# Patient Record
Sex: Female | Born: 1950 | Race: White | Hispanic: No | Marital: Single | Smoking: Former smoker
Health system: Southern US, Community
[De-identification: ages and names within clinical notes are randomized; demographics above are authoritative.]

## PROBLEM LIST (undated history)

## (undated) DIAGNOSIS — I10 Essential (primary) hypertension: Secondary | ICD-10-CM

## (undated) DIAGNOSIS — E7800 Pure hypercholesterolemia, unspecified: Secondary | ICD-10-CM

## (undated) DIAGNOSIS — I2510 Atherosclerotic heart disease of native coronary artery without angina pectoris: Secondary | ICD-10-CM

## (undated) DIAGNOSIS — K635 Polyp of colon: Secondary | ICD-10-CM

## (undated) HISTORY — DX: Polyp of colon: K63.5

## (undated) HISTORY — PX: KNEE SURGERY: SHX244

## (undated) HISTORY — PX: BREAST EXCISIONAL BIOPSY: SUR124

## (undated) HISTORY — PX: TONSILLECTOMY: SUR1361

---

## 2009-02-14 ENCOUNTER — Ambulatory Visit: Payer: Self-pay | Admitting: Psychiatry

## 2009-02-28 ENCOUNTER — Ambulatory Visit: Payer: Self-pay | Admitting: Psychiatry

## 2010-09-12 ENCOUNTER — Encounter: Admission: RE | Admit: 2010-09-12 | Discharge: 2010-09-12 | Payer: Self-pay | Admitting: Family Medicine

## 2011-01-21 ENCOUNTER — Encounter: Payer: Self-pay | Admitting: Family Medicine

## 2011-11-21 ENCOUNTER — Other Ambulatory Visit: Payer: Self-pay | Admitting: Family Medicine

## 2011-11-21 DIAGNOSIS — Z1231 Encounter for screening mammogram for malignant neoplasm of breast: Secondary | ICD-10-CM

## 2011-12-04 ENCOUNTER — Ambulatory Visit
Admission: RE | Admit: 2011-12-04 | Discharge: 2011-12-04 | Disposition: A | Discharge status: Home / Self Care | Payer: Self-pay | Source: Ambulatory Visit | Attending: Family Medicine | Admitting: Family Medicine

## 2011-12-04 DIAGNOSIS — Z1231 Encounter for screening mammogram for malignant neoplasm of breast: Secondary | ICD-10-CM

## 2012-07-22 ENCOUNTER — Other Ambulatory Visit: Payer: Self-pay | Admitting: Internal Medicine

## 2012-07-22 ENCOUNTER — Ambulatory Visit (INDEPENDENT_AMBULATORY_CARE_PROVIDER_SITE_OTHER): Payer: 59

## 2012-07-22 DIAGNOSIS — M545 Low back pain: Secondary | ICD-10-CM

## 2012-07-22 DIAGNOSIS — M5137 Other intervertebral disc degeneration, lumbosacral region: Secondary | ICD-10-CM

## 2012-07-22 DIAGNOSIS — M5126 Other intervertebral disc displacement, lumbar region: Secondary | ICD-10-CM

## 2012-12-29 ENCOUNTER — Other Ambulatory Visit: Payer: Self-pay | Admitting: Family Medicine

## 2012-12-29 DIAGNOSIS — Z1231 Encounter for screening mammogram for malignant neoplasm of breast: Secondary | ICD-10-CM

## 2012-12-30 ENCOUNTER — Ambulatory Visit (INDEPENDENT_AMBULATORY_CARE_PROVIDER_SITE_OTHER): Payer: 59

## 2012-12-30 DIAGNOSIS — Z1231 Encounter for screening mammogram for malignant neoplasm of breast: Secondary | ICD-10-CM

## 2013-06-11 IMAGING — CR DG LUMBAR SPINE 2-3V
3 series · 3 of 3 positions shown · non-contrast
Comparison: none

CLINICAL DATA: Back pain and left radiculopathy.

LUMBAR SPINE - 2-3 VIEW

[view not recorded (1 of 3)]
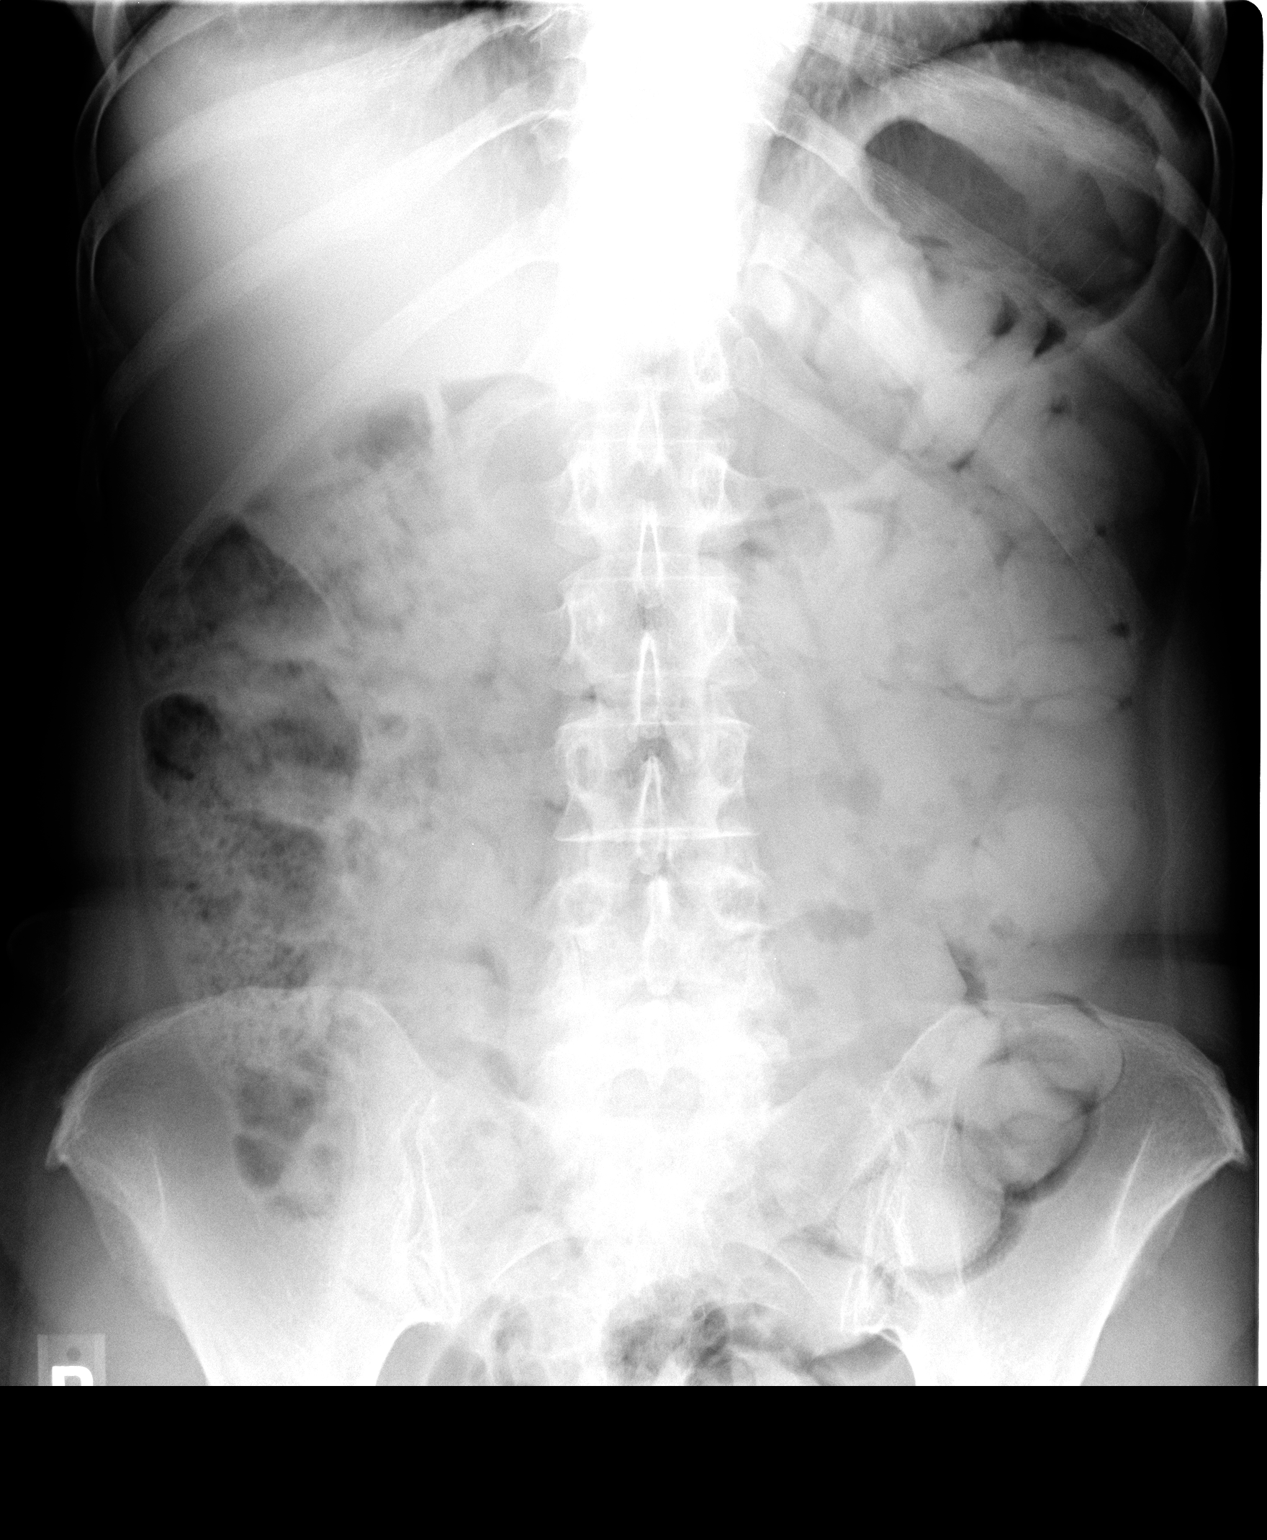

[view not recorded (2 of 3)]
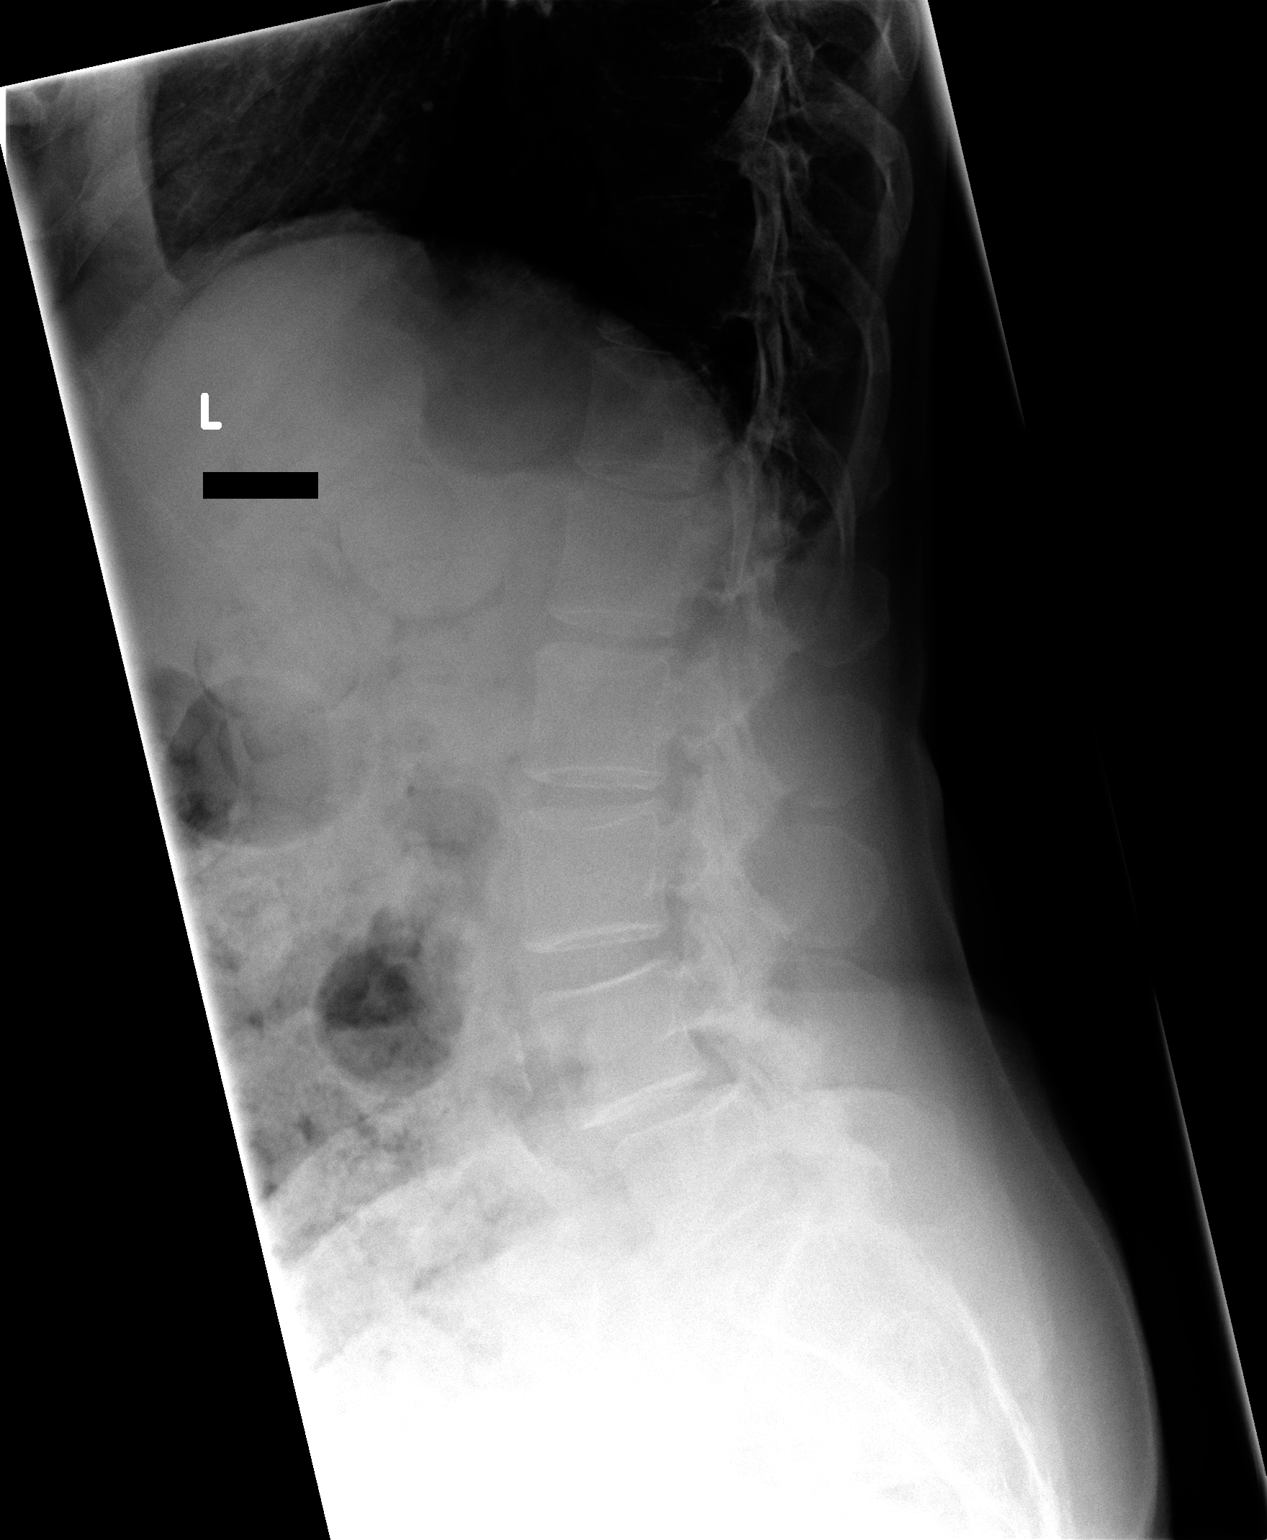

[view not recorded (3 of 3)]
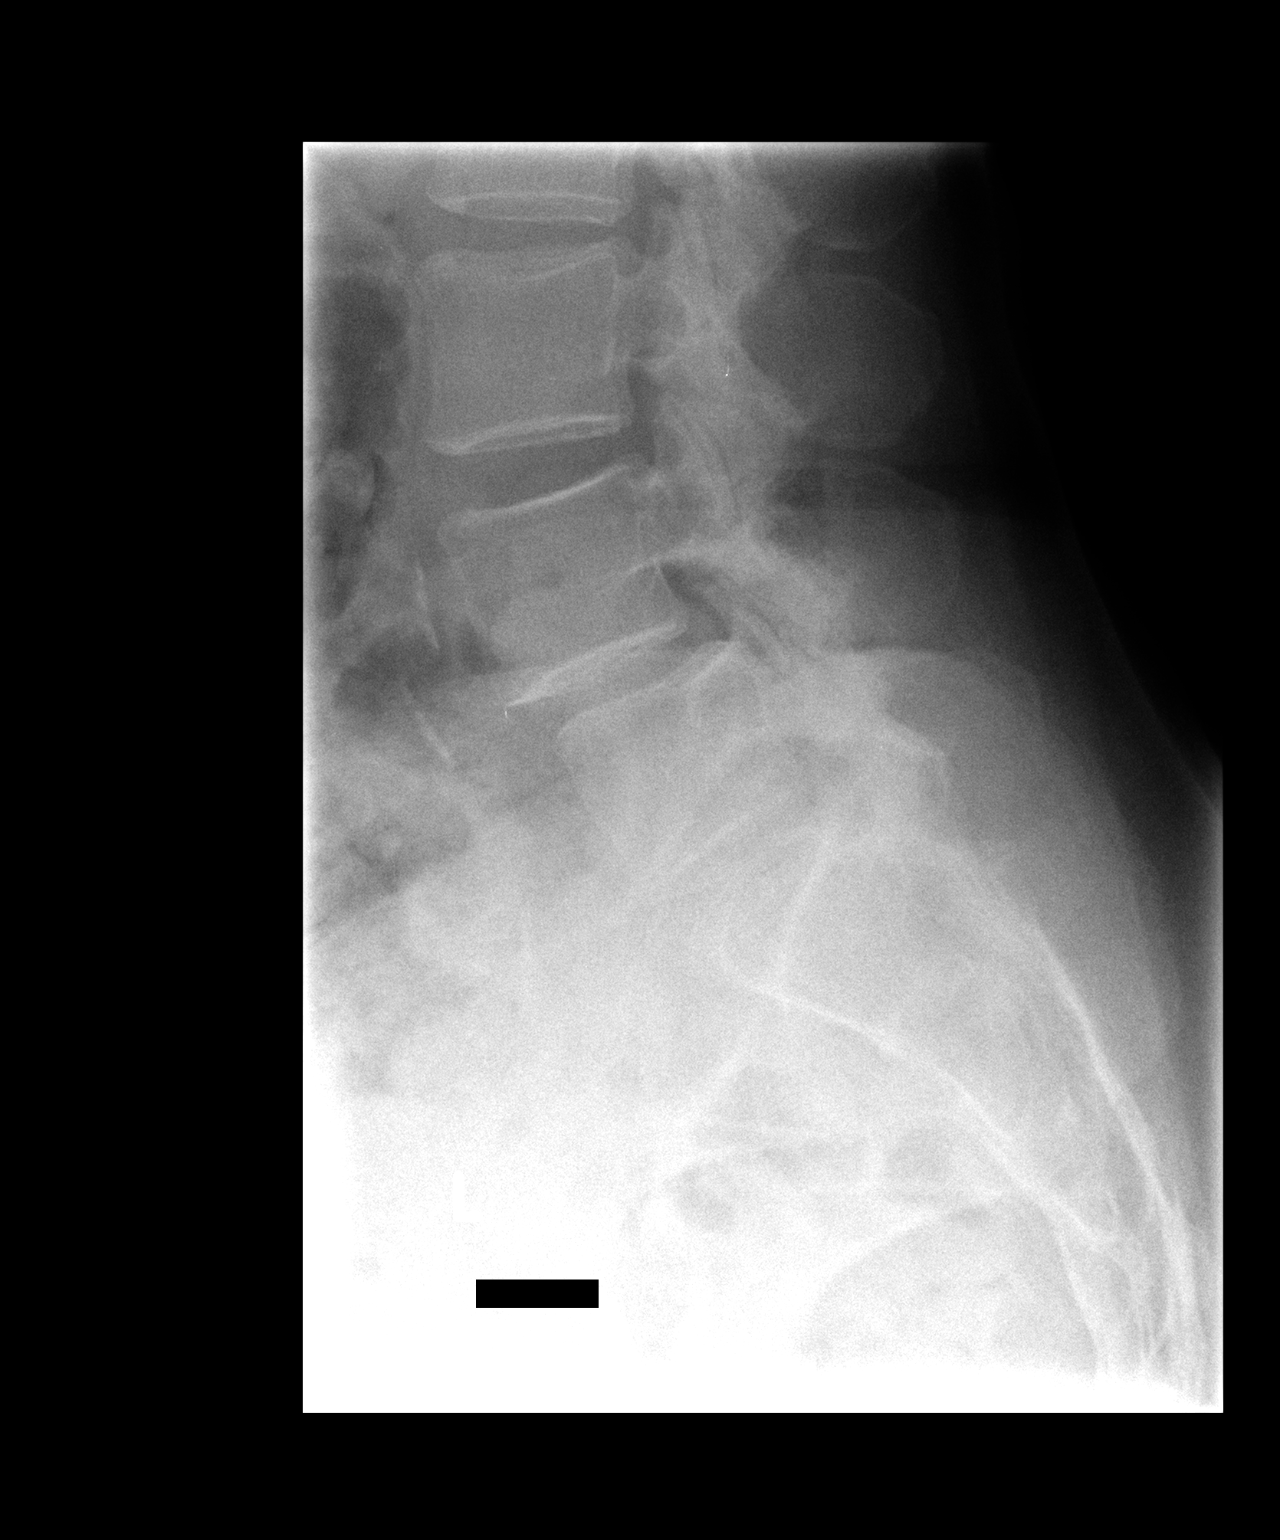

[3 of 3 positions shown; findings below may reference images not displayed]

FINDINGS: 6 mm of facet mediated slip L4-L5.  Mild vascular
calcification.  No osseous destructive lesion.  No visible pars
defects.  Mild disc space narrowing L5-S1.  No visible abnormal
calcifications in the abdomen.  Moderate stool burden.
IMPRESSION: 6 mm anterolisthesis L4-5.  This is likely facet mediated.  Mild
disc space narrowing L5-S1.

## 2013-12-25 ENCOUNTER — Other Ambulatory Visit: Payer: Self-pay | Admitting: Family Medicine

## 2013-12-25 DIAGNOSIS — Z1231 Encounter for screening mammogram for malignant neoplasm of breast: Secondary | ICD-10-CM

## 2014-01-05 ENCOUNTER — Ambulatory Visit (INDEPENDENT_AMBULATORY_CARE_PROVIDER_SITE_OTHER): Payer: 59

## 2014-01-05 DIAGNOSIS — Z1231 Encounter for screening mammogram for malignant neoplasm of breast: Secondary | ICD-10-CM

## 2014-01-05 DIAGNOSIS — R928 Other abnormal and inconclusive findings on diagnostic imaging of breast: Secondary | ICD-10-CM

## 2014-01-08 ENCOUNTER — Other Ambulatory Visit: Payer: Self-pay | Admitting: Family Medicine

## 2014-01-08 DIAGNOSIS — R928 Other abnormal and inconclusive findings on diagnostic imaging of breast: Secondary | ICD-10-CM

## 2014-01-21 ENCOUNTER — Ambulatory Visit
Admission: RE | Admit: 2014-01-21 | Discharge: 2014-01-21 | Disposition: A | Discharge status: Home / Self Care | Payer: 59 | Source: Ambulatory Visit | Attending: Family Medicine | Admitting: Family Medicine

## 2014-01-21 DIAGNOSIS — R928 Other abnormal and inconclusive findings on diagnostic imaging of breast: Secondary | ICD-10-CM

## 2015-01-27 ENCOUNTER — Other Ambulatory Visit: Payer: Self-pay | Admitting: Family Medicine

## 2015-01-27 DIAGNOSIS — Z139 Encounter for screening, unspecified: Secondary | ICD-10-CM

## 2015-02-03 ENCOUNTER — Ambulatory Visit (INDEPENDENT_AMBULATORY_CARE_PROVIDER_SITE_OTHER): Payer: 59

## 2015-02-03 DIAGNOSIS — Z1231 Encounter for screening mammogram for malignant neoplasm of breast: Secondary | ICD-10-CM

## 2015-02-03 DIAGNOSIS — Z139 Encounter for screening, unspecified: Secondary | ICD-10-CM

## 2016-03-12 ENCOUNTER — Other Ambulatory Visit: Payer: Self-pay | Admitting: Family Medicine

## 2016-03-12 DIAGNOSIS — Z1231 Encounter for screening mammogram for malignant neoplasm of breast: Secondary | ICD-10-CM

## 2016-03-14 ENCOUNTER — Ambulatory Visit (INDEPENDENT_AMBULATORY_CARE_PROVIDER_SITE_OTHER): Payer: 59

## 2016-03-14 DIAGNOSIS — Z1231 Encounter for screening mammogram for malignant neoplasm of breast: Secondary | ICD-10-CM | POA: Diagnosis not present

## 2016-06-17 ENCOUNTER — Emergency Department
Admission: EM | Admit: 2016-06-17 | Discharge: 2016-06-17 | Disposition: A | Discharge status: Home / Self Care | Payer: 59 | Source: Home / Self Care | Attending: Family Medicine | Admitting: Family Medicine

## 2016-06-17 ENCOUNTER — Encounter: Payer: Self-pay | Admitting: Emergency Medicine

## 2016-06-17 DIAGNOSIS — M25552 Pain in left hip: Secondary | ICD-10-CM

## 2016-06-17 DIAGNOSIS — M5432 Sciatica, left side: Secondary | ICD-10-CM

## 2016-06-17 HISTORY — DX: Atherosclerotic heart disease of native coronary artery without angina pectoris: I25.10

## 2016-06-17 HISTORY — DX: Pure hypercholesterolemia, unspecified: E78.00

## 2016-06-17 HISTORY — DX: Essential (primary) hypertension: I10

## 2016-06-17 MED ORDER — METHYLPREDNISOLONE ACETATE 40 MG/ML IJ SUSP
80.0000 mg | Freq: Once | INTRAMUSCULAR | Status: AC
  Administered 2016-06-17: 80 mg via INTRAMUSCULAR

## 2016-06-17 MED ORDER — KETOROLAC TROMETHAMINE 60 MG/2ML IM SOLN
30.0000 mg | Freq: Once | INTRAMUSCULAR | Status: AC
  Administered 2016-06-17: 30 mg via INTRAMUSCULAR

## 2016-06-17 NOTE — ED Provider Notes (Signed)
CSN: [id number]     Arrival date & time 06/17/16  1519 History   First MD Initiated Contact with Patient 06/17/16 1543     Chief Complaint  Patient presents with  . Sciatica   (Consider location/radiation/quality/duration/timing/severity/associated sxs/prior Treatment) HPI  Kendra Proctor is a 65 y.o. female presenting to UC with c/o gradually worsening Left sided hip pain that radiates into her Left thigh. Pain is aching and sharp, worse with standing and ambulating. Pain is 5/10, no relief with acetaminophen.  Pain started last night after working outside yesterday. Denies known injury but does report hx of similar pain.  Per medical records, pt received a shot of depomedrol and symptoms resolved. Pt states she cannot tolerate PO prednisone as it causes diffuse swelling.   Past Medical History  Diagnosis Date  . Hypertension   . Coronary artery disease   . High cholesterol    Past Surgical History  Procedure Laterality Date  . Tonsillectomy    . Knee surgery     History reviewed. No pertinent family history. Social History  Substance Use Topics  . Smoking status: Former Smoker    Quit date: 06/17/2014  . Smokeless tobacco: None  . Alcohol Use: Yes     Comment: Occasional   OB History    No data available     Review of Systems  Constitutional: Negative for fever and chills.  Genitourinary: Negative for dysuria, frequency, hematuria and flank pain.  Musculoskeletal: Positive for myalgias, back pain, arthralgias and gait problem. Negative for joint swelling, neck pain and neck stiffness.  Skin: Negative for rash.    Allergies  Prednisone; Propoxyphene; and Wellbutrin  Home Medications   Prior to Admission medications   Medication Sig Start Date End Date Taking? Authorizing Provider  FLUoxetine (PROZAC) 20 MG capsule Take 20 mg by mouth daily.   Yes Historical Provider, MD  simvastatin (ZOCOR) 80 MG tablet Take 80 mg by mouth daily.   Yes Historical Provider, MD    Meds Ordered and Administered this Visit   Medications  ketorolac (TORADOL) injection 30 mg (30 mg Intramuscular Given 06/17/16 1557)  methylPREDNISolone acetate (DEPO-MEDROL) injection 80 mg (80 mg Intramuscular Given 06/17/16 1558)    BP 161/83 mmHg  Pulse 81  Temp(Src) 97.7 F (36.5 C) (Oral)  Resp 16  Ht 5' 3.5" (1.613 m)  Wt 170 lb (77.111 kg)  BMI 29.64 kg/m2  SpO2 98% No data found.   Physical Exam  Constitutional: She is oriented to person, place, and time. She appears well-developed and well-nourished.  HENT:  Head: Normocephalic and atraumatic.  Eyes: EOM are normal.  Neck: Normal range of motion.  Cardiovascular: Normal rate.   Pulmonary/Chest: Effort normal.  Musculoskeletal: Normal range of motion. She exhibits tenderness. She exhibits no edema.  No midline spinal tenderness. Mild tenderness to Left lower back muscles, tenderness over Left hip, Left buttock, and Left lateral thigh. No crepitus. Full ROM. Negative straight leg raise.   Neurological: She is alert and oriented to person, place, and time.  Antalgic gait.  Skin: Skin is warm and dry. No rash noted. No erythema.  Psychiatric: She has a normal mood and affect. Her behavior is normal.  Nursing note and vitals reviewed.   ED Course  Procedures (including critical care time)  Labs Review Labs Reviewed - No data to display  Imaging Review No results found.   MDM   1. Left hip pain   2. Left sided sciatica    Hx and  exam c/w Left sided sciatica. No red flag symptoms. No indication for imaging at this time.  Tx in UC: Depomedrol 80mg  IM and Toradol 30mg  IM  Pt info provided with home exercises. May use acetaminophen and ibuprofen at home. Alternate ice and heat.  Encouraged f/u with PCP or Sports Medicine in 1 week if not improving. Patient verbalized understanding and  agreement with treatment plan.     Junius Finnerrin O'Malley, PA-C 06/17/16 1610

## 2016-06-17 NOTE — ED Notes (Signed)
Patient advised she was working outside yesterday and shortly after developed pain in the left hip radiating down the left leg.

## 2016-11-02 DIAGNOSIS — L219 Seborrheic dermatitis, unspecified: Secondary | ICD-10-CM | POA: Insufficient documentation

## 2017-02-10 ENCOUNTER — Encounter: Payer: Self-pay | Admitting: Emergency Medicine

## 2017-02-10 ENCOUNTER — Emergency Department
Admission: EM | Admit: 2017-02-10 | Discharge: 2017-02-10 | Disposition: A | Discharge status: Home / Self Care | Payer: 59 | Source: Home / Self Care | Attending: Family Medicine | Admitting: Family Medicine

## 2017-02-10 DIAGNOSIS — N3001 Acute cystitis with hematuria: Secondary | ICD-10-CM | POA: Diagnosis not present

## 2017-02-10 LAB — POCT URINALYSIS DIP (MANUAL ENTRY)
BILIRUBIN UA: NEGATIVE
Bilirubin, UA: NEGATIVE
Glucose, UA: NEGATIVE
Nitrite, UA: NEGATIVE
PH UA: 6 (ref 5–8)
SPEC GRAV UA: 1.025 (ref 1.005–1.03)
Urobilinogen, UA: 4 (ref 0–1)

## 2017-02-10 MED ORDER — CEPHALEXIN 500 MG PO CAPS: 500.0000 mg | ORAL_CAPSULE | Freq: Two times a day (BID) | ORAL | 0 refills | Status: DC

## 2017-02-10 NOTE — ED Triage Notes (Signed)
[facility]/O dysuria, spasms and urgency. Symptoms since last PM.

## 2017-02-10 NOTE — ED Provider Notes (Signed)
CSN: [id number]     Arrival date & time 02/10/17  1509 History   First MD Initiated Contact with Patient 02/10/17 1549     Chief Complaint  Patient presents with  . Urinary Tract Infection   (Consider location/radiation/quality/duration/timing/severity/associated sxs/prior Treatment) HPI  Kendra Proctor is a 66 y.o. female presenting to UC with c/o dysuria, bladder spasms, and urinary urgency since last night.  She notes she went out of town recently and has not been drinking as much water as she normally does.  Her last UTI was at least 10 years ago but thinks recent change in her routine is what caused current symptoms. Denies fever, chills, n/v/d. Denies back pain at this time but has mild bladder discomfort.   Past Medical History:  Diagnosis Date  . Coronary artery disease   . High cholesterol   . Hypertension    Past Surgical History:  Procedure Laterality Date  . KNEE SURGERY    . TONSILLECTOMY     History reviewed. No pertinent family history. Social History  Substance Use Topics  . Smoking status: Former Smoker    Quit date: 06/17/2014  . Smokeless tobacco: Never Used  . Alcohol use Yes     Comment: Occasional   OB History    No data available     Review of Systems  Constitutional: Negative for chills and fever.  Gastrointestinal: Positive for abdominal pain ( bladder spasms). Negative for diarrhea, nausea and vomiting.  Genitourinary: Positive for dysuria, frequency and urgency. Negative for decreased urine volume, hematuria, vaginal bleeding, vaginal discharge and vaginal pain.  Musculoskeletal: Negative for back pain and myalgias.  Skin: Negative for rash.    Allergies  Prednisone; Propoxyphene; and Wellbutrin [bupropion]  Home Medications   Prior to Admission medications   Medication Sig Start Date End Date Taking? Authorizing Provider  lisinopril (PRINIVIL,ZESTRIL) 5 MG tablet Take 5 mg by mouth daily.   Yes Historical Provider, MD  cephALEXin  (KEFLEX) 500 MG capsule Take 1 capsule (500 mg total) by mouth 2 (two) times daily. 02/10/17   Junius FinnerErin O'Malley, PA-C  FLUoxetine (PROZAC) 20 MG capsule Take 20 mg by mouth daily.    Historical Provider, MD  simvastatin (ZOCOR) 80 MG tablet Take 80 mg by mouth daily.    Historical Provider, MD   Meds Ordered and Administered this Visit  Medications - No data to display  BP 144/85   Pulse 85   Temp 98.2 F (36.8 C) (Oral)   Resp 16   Ht 5\' 3"  (1.6 m)   Wt 166 lb 4 oz (75.4 kg)   SpO2 99%   BMI 29.45 kg/m  No data found.   Physical Exam  Constitutional: She is oriented to person, place, and time. She appears well-developed and well-nourished. No distress.  HENT:  Head: Normocephalic and atraumatic.  Mouth/Throat: Oropharynx is clear and moist.  Eyes: EOM are normal.  Neck: Normal range of motion.  Cardiovascular: Normal rate and regular rhythm.   Pulmonary/Chest: Effort normal and breath sounds normal. No respiratory distress. She has no wheezes. She has no rales.  Abdominal: Soft. She exhibits no distension and no mass. There is no tenderness. There is no rebound, no guarding and no CVA tenderness.  Musculoskeletal: Normal range of motion.  Neurological: She is alert and oriented to person, place, and time.  Skin: Skin is warm and dry. She is not diaphoretic.  Psychiatric: She has a normal mood and affect. Her behavior is normal.  Nursing note  and vitals reviewed.   Urgent Care Course     Procedures (including critical care time)  Labs Review Labs Reviewed  POCT URINALYSIS DIP (MANUAL ENTRY) - Abnormal; Notable for the following:       Result Value   Blood, UA moderate (*)    Protein Ur, POC trace (*)    Leukocytes, UA small (1+) (*)    All other components within normal limits  URINE CULTURE    Imaging Review No results found.    MDM   1. Acute cystitis with hematuria    Hx and exam c/w UTI, will send urine culture.  Rx: Keflex  Encouraged good  hydration. F/u with PCP last this week if not improving, sooner if worsening.     Junius Finner, PA-C 02/10/17 1739

## 2017-02-12 ENCOUNTER — Telehealth: Payer: Self-pay

## 2017-02-12 NOTE — Telephone Encounter (Signed)
Left message with lab results and to call UC if any questions or concerns.

## 2017-02-13 LAB — URINE CULTURE

## 2017-02-27 ENCOUNTER — Other Ambulatory Visit: Payer: Self-pay | Admitting: Family Medicine

## 2017-02-27 DIAGNOSIS — Z1231 Encounter for screening mammogram for malignant neoplasm of breast: Secondary | ICD-10-CM

## 2017-04-05 ENCOUNTER — Ambulatory Visit: Payer: 59

## 2017-05-08 ENCOUNTER — Ambulatory Visit (INDEPENDENT_AMBULATORY_CARE_PROVIDER_SITE_OTHER): Payer: 59

## 2017-05-08 DIAGNOSIS — Z1231 Encounter for screening mammogram for malignant neoplasm of breast: Secondary | ICD-10-CM | POA: Diagnosis not present

## 2017-11-20 ENCOUNTER — Other Ambulatory Visit: Payer: Self-pay | Admitting: Family Medicine

## 2017-11-20 DIAGNOSIS — Z780 Asymptomatic menopausal state: Secondary | ICD-10-CM

## 2017-11-27 ENCOUNTER — Ambulatory Visit (INDEPENDENT_AMBULATORY_CARE_PROVIDER_SITE_OTHER): Payer: 59

## 2017-11-27 DIAGNOSIS — M85851 Other specified disorders of bone density and structure, right thigh: Secondary | ICD-10-CM

## 2017-11-27 DIAGNOSIS — Z780 Asymptomatic menopausal state: Secondary | ICD-10-CM

## 2017-11-29 DIAGNOSIS — M85852 Other specified disorders of bone density and structure, left thigh: Secondary | ICD-10-CM | POA: Insufficient documentation

## 2018-01-04 ENCOUNTER — Other Ambulatory Visit: Payer: Self-pay

## 2018-01-04 ENCOUNTER — Encounter: Payer: Self-pay | Admitting: Emergency Medicine

## 2018-01-04 ENCOUNTER — Emergency Department
Admission: EM | Admit: 2018-01-04 | Discharge: 2018-01-04 | Disposition: A | Discharge status: Home / Self Care | Payer: 59 | Source: Home / Self Care | Attending: Family Medicine | Admitting: Family Medicine

## 2018-01-04 DIAGNOSIS — R21 Rash and other nonspecific skin eruption: Secondary | ICD-10-CM

## 2018-01-04 MED ORDER — METHYLPREDNISOLONE ACETATE 80 MG/ML IJ SUSP
80.0000 mg | Freq: Once | INTRAMUSCULAR | Status: AC
  Administered 2018-01-04: 80 mg via INTRAMUSCULAR

## 2018-01-04 NOTE — ED Triage Notes (Signed)
[facility] a [facility] of a Rash on entire body

## 2018-01-04 NOTE — ED Provider Notes (Signed)
[facility]    CSN: [id number] Arrival date & time: 01/04/18  1206     History   Chief Complaint Chief Complaint  Patient presents with  . Rash    HPI Kendra Proctor is a 67 y.o. female.   Patient complains of approximately one month history of pruritic rash on her upper arms, forearms, and face.  She had a similar rash about two months ago.  The rash is somewhat improved when she uses a steroid cream and Aveeno wash.  She also has irritation in her scalp that has responded in the past to Derma-Smooth.  No recent medication changes or known contact with allergens.   The history is provided by the patient.  Rash  Location: arms and face. Quality: dryness, itchiness and redness   Quality: not blistering, not burning, not draining, not painful, not peeling, not scaling, not swelling and not weeping   Severity:  Mild Onset quality:  Gradual Duration:  1 month Timing:  Constant Progression:  Unchanged Chronicity:  Recurrent Context: not animal contact, not chemical exposure, not eggs, not exposure to similar rash, not food, not hot tub use, not insect bite/sting, not medications, not new detergent/soap, not nuts and not plant contact   Relieved by:  Nothing Worsened by:  Nothing Ineffective treatments:  Topical steroids Associated symptoms: no abdominal pain, no diarrhea, no fatigue, no fever, no headaches, no induration, no joint pain, no myalgias, no nausea, no shortness of breath, no sore throat and no URI     Past Medical History:  Diagnosis Date  . Coronary artery disease   . High cholesterol   . Hypertension     There are no active problems to display for this patient.   Past Surgical History:  Procedure Laterality Date  . BREAST EXCISIONAL BIOPSY    . KNEE SURGERY    . TONSILLECTOMY      OB History    No data available       Home Medications    Prior to Admission medications   Medication Sig Start Date End Date Taking? Authorizing  Provider  cephALEXin (KEFLEX) 500 MG capsule Take 1 capsule (500 mg total) by mouth 2 (two) times daily. 02/10/17   Lurene Shadow, PA-C  FLUoxetine (PROZAC) 20 MG capsule Take 20 mg by mouth daily.    [provider]  lisinopril (PRINIVIL,ZESTRIL) 5 MG tablet Take 5 mg by mouth daily.    [provider]  simvastatin (ZOCOR) 80 MG tablet Take 80 mg by mouth daily.    [provider]    Family History History reviewed. No pertinent family history.  Social History Social History   Tobacco Use  . Smoking status: Former Smoker    Last attempt to quit: 06/17/2014    Years since quitting: 3.5  . Smokeless tobacco: Never Used  Substance Use Topics  . Alcohol use: Yes    Comment: Occasional  . Drug use: No     Allergies   Prednisone; Propoxyphene; and Wellbutrin [bupropion]   Review of Systems Review of Systems  Constitutional: Negative for fatigue and fever.  HENT: Negative for sore throat.   Respiratory: Negative for shortness of breath.   Gastrointestinal: Negative for abdominal pain, diarrhea and nausea.  Musculoskeletal: Negative for arthralgias and myalgias.  Skin: Positive for rash.  Neurological: Negative for headaches.  All other systems reviewed and are negative.    Physical Exam Triage Vital Signs ED Triage Vitals [01/04/18 1230]  Enc Vitals Group  BP (!) 141/71     Pulse Rate 75     Resp 16     Temp 97.6 F (36.4 C)     Temp Source Oral     SpO2 100 %     Weight 160 lb (72.6 kg)     Height 5\' 3"  (1.6 m)     Head Circumference      Peak Flow      Pain Score 4     Pain Loc      Pain Edu?      Excl. in [place]?    No data found.  Updated Vital Signs BP (!) 141/71 (BP Location: Left Arm)   Pulse 75   Temp 97.6 F (36.4 C) (Oral)   Resp 16   Ht 5\' 3"  (1.6 m)   Wt 160 lb (72.6 kg)   SpO2 100%   BMI 28.34 kg/m   Visual Acuity Right Eye Distance:   Left Eye Distance:   Bilateral Distance:    Right Eye Near:   Left  Eye Near:    Bilateral Near:     Physical Exam  Constitutional: She appears well-developed and well-nourished. No distress.  HENT:  Right Ear: External ear normal.  Left Ear: External ear normal.  Nose: Nose normal.  Mouth/Throat: Oropharynx is clear and moist.  Eyes: Conjunctivae and EOM are normal. Pupils are equal, round, and reactive to light.  Neck: Neck supple.  Cardiovascular: Normal heart sounds.  Pulmonary/Chest: Breath sounds normal.  Abdominal: There is no tenderness.  Musculoskeletal: She exhibits no edema.  Lymphadenopathy:    She has no cervical adenopathy.  Neurological: She is alert.  Skin: Skin is warm and dry. Rash noted. Rash is maculopapular.     Arms reveal hyperkeratotic follicular papules with variable perifollicular erythema on extensor surfaces.  There numerous healed 2 to 5mm diameter healed excoriations on the forearms. Scalp reveals plaque-like erythema, especially at the margins.  Nursing note and vitals reviewed.    UC Treatments / Results  Labs (all labs ordered are listed, but only abnormal results are displayed) Labs Reviewed - No data to display  EKG  EKG Interpretation None       Radiology No results found.  Procedures Procedures (including critical care time)  Medications Ordered in UC Medications  methylPREDNISolone acetate (DEPO-MEDROL) injection 80 mg (not administered)     Initial Impression / Assessment and Plan / UC Course  I have reviewed the triage vital signs and the nursing notes.  Pertinent labs & imaging results that were available during my care of the patient were reviewed by me and considered in my medical decision making (see chart for details).    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 Administered Depo Medrol 80mg  IM. Try using creams containing alpha hydroxy acid, lactic acid, or urea. May use Derma Smoothe oil in the scalp Continue hydroxyzine as needed for itching.       Final Clinical Impressions(s) / UC Diagnoses   Final diagnoses:  Rash and nonspecific skin eruption    ED Discharge Orders    None          Lattie HawBeese, Stephen A, MD 01/06/18 1735

## 2018-01-04 NOTE — Discharge Instructions (Signed)
Try using creams containing alpha hydroxy acid, lactic acid, or urea. May use Derma Smoothe oil in the scalp

## 2018-05-12 ENCOUNTER — Other Ambulatory Visit: Payer: Self-pay | Admitting: Family Medicine

## 2018-05-12 DIAGNOSIS — Z1231 Encounter for screening mammogram for malignant neoplasm of breast: Secondary | ICD-10-CM

## 2018-05-16 ENCOUNTER — Ambulatory Visit (INDEPENDENT_AMBULATORY_CARE_PROVIDER_SITE_OTHER): Payer: 59

## 2018-05-16 DIAGNOSIS — R928 Other abnormal and inconclusive findings on diagnostic imaging of breast: Secondary | ICD-10-CM | POA: Diagnosis not present

## 2018-05-16 DIAGNOSIS — Z1231 Encounter for screening mammogram for malignant neoplasm of breast: Secondary | ICD-10-CM

## 2018-05-20 ENCOUNTER — Other Ambulatory Visit: Payer: Self-pay | Admitting: Family Medicine

## 2018-05-20 DIAGNOSIS — R928 Other abnormal and inconclusive findings on diagnostic imaging of breast: Secondary | ICD-10-CM

## 2018-05-23 ENCOUNTER — Ambulatory Visit
Admission: RE | Admit: 2018-05-23 | Discharge: 2018-05-23 | Disposition: A | Discharge status: Home / Self Care | Payer: 59 | Source: Ambulatory Visit | Attending: Family Medicine | Admitting: Family Medicine

## 2018-05-23 DIAGNOSIS — R928 Other abnormal and inconclusive findings on diagnostic imaging of breast: Secondary | ICD-10-CM

## 2019-05-22 DIAGNOSIS — M2022 Hallux rigidus, left foot: Secondary | ICD-10-CM | POA: Insufficient documentation

## 2019-06-08 ENCOUNTER — Other Ambulatory Visit: Payer: Self-pay | Admitting: Family Medicine

## 2019-06-08 DIAGNOSIS — Z0000 Encounter for general adult medical examination without abnormal findings: Secondary | ICD-10-CM

## 2019-07-01 ENCOUNTER — Ambulatory Visit (INDEPENDENT_AMBULATORY_CARE_PROVIDER_SITE_OTHER): Payer: 59

## 2019-07-01 ENCOUNTER — Other Ambulatory Visit: Payer: Self-pay

## 2019-07-01 DIAGNOSIS — Z0000 Encounter for general adult medical examination without abnormal findings: Secondary | ICD-10-CM | POA: Diagnosis not present

## 2019-11-30 ENCOUNTER — Other Ambulatory Visit: Payer: Self-pay | Admitting: Family Medicine

## 2019-11-30 DIAGNOSIS — M85852 Other specified disorders of bone density and structure, left thigh: Secondary | ICD-10-CM

## 2019-12-09 ENCOUNTER — Ambulatory Visit (INDEPENDENT_AMBULATORY_CARE_PROVIDER_SITE_OTHER): Payer: 59

## 2019-12-09 ENCOUNTER — Other Ambulatory Visit: Payer: Self-pay

## 2019-12-09 DIAGNOSIS — M85852 Other specified disorders of bone density and structure, left thigh: Secondary | ICD-10-CM | POA: Diagnosis not present

## 2020-05-24 ENCOUNTER — Emergency Department (INDEPENDENT_AMBULATORY_CARE_PROVIDER_SITE_OTHER): Payer: 59

## 2020-05-24 ENCOUNTER — Other Ambulatory Visit: Payer: Self-pay

## 2020-05-24 ENCOUNTER — Emergency Department
Admission: EM | Admit: 2020-05-24 | Discharge: 2020-05-24 | Disposition: A | Discharge status: Home / Self Care | Payer: 59 | Source: Home / Self Care

## 2020-05-24 DIAGNOSIS — M79674 Pain in right toe(s): Secondary | ICD-10-CM

## 2020-05-24 MED ORDER — TRAMADOL HCL 50 MG PO TABS: 50.0000 mg | ORAL_TABLET | Freq: Four times a day (QID) | ORAL | 0 refills | Status: DC | PRN

## 2020-05-24 NOTE — ED Provider Notes (Signed)
[facility]    CSN: [id number] Arrival date & time: 05/24/20  0804      History   Chief Complaint Chief Complaint  Patient presents with  . Gout    HPI Kendra Proctor is a 69 y.o. female.   HPI  Kendra Proctor is a 69 y.o. female presenting to UC with c/o 4 days of pain in her Right second toe. Pain started after she rolled her foot and stumbled out of her shoe. She did not have much pain at that time but has since had worsening pain that is aching and throbbing but only in her 2nd toe. She initially thought it was gout, which she has had in her great toe in the past, but she has taken colchicine 0.6mg  w/o relief.  She also elevated her foot and took ibuprofen last night w/o relief.  Pain is 9/10 at worst. No pain medication taken this morning.   Past Medical History:  Diagnosis Date  . Coronary artery disease   . High cholesterol   . Hypertension     There are no problems to display for this patient.   Past Surgical History:  Procedure Laterality Date  . BREAST EXCISIONAL BIOPSY    . KNEE SURGERY    . TONSILLECTOMY      OB History   No obstetric history on file.      Home Medications    Prior to Admission medications   Medication Sig Start Date End Date Taking? Authorizing Provider  cephALEXin (KEFLEX) 500 MG capsule Take 1 capsule (500 mg total) by mouth 2 (two) times daily. 02/10/17   Lurene Shadow, PA-C  lisinopril (PRINIVIL,ZESTRIL) 5 MG tablet Take 5 mg by mouth daily.    [provider]  simvastatin (ZOCOR) 80 MG tablet Take 80 mg by mouth daily.    [provider]  traMADol (ULTRAM) 50 MG tablet Take 1 tablet (50 mg total) by mouth every 6 (six) hours as needed. 05/24/20   Lurene Shadow, PA-C  FLUoxetine (PROZAC) 20 MG capsule Take 20 mg by mouth daily.  05/24/20  [provider]    Family History Family History  Problem Relation Age of Onset  . Cancer Mother   . Stroke Father     Social History Social  History   Tobacco Use  . Smoking status: Former Smoker    Quit date: 06/17/2014    Years since quitting: 5.9  . Smokeless tobacco: Never Used  Substance Use Topics  . Alcohol use: Yes    Comment: Occasional  . Drug use: No     Allergies   Prednisone, Propoxyphene, and Wellbutrin [bupropion]   Review of Systems Review of Systems  Musculoskeletal: Positive for arthralgias. Negative for gait problem and joint swelling.  Skin: Positive for color change. Negative for wound.  Neurological: Negative for weakness and numbness.     Physical Exam Triage Vital Signs ED Triage Vitals  Enc Vitals Group     BP 05/24/20 0822 116/80     Pulse Rate 05/24/20 0822 99     Resp 05/24/20 0822 18     Temp 05/24/20 0822 99.1 F (37.3 C)     Temp Source 05/24/20 0822 Oral     SpO2 05/24/20 0822 99 %     Weight --      Height --      Head Circumference --      Peak Flow --      Pain Score 05/24/20  7209 9     Pain Loc --      Pain Edu? --      Excl. in [place]? --    No data found.  Updated Vital Signs BP 116/80 (BP Location: Right Arm)   Pulse 99   Temp 99.1 F (37.3 C) (Oral)   Resp 18   SpO2 99%   Visual Acuity Right Eye Distance:   Left Eye Distance:   Bilateral Distance:    Right Eye Near:   Left Eye Near:    Bilateral Near:     Physical Exam Vitals and nursing note reviewed.  Constitutional:      Appearance: She is well-developed.  HENT:     Head: Normocephalic and atraumatic.  Cardiovascular:     Rate and Rhythm: Normal rate.  Pulmonary:     Effort: Pulmonary effort is normal.  Musculoskeletal:        General: Tenderness present. No swelling. Normal range of motion.     Cervical back: Normal range of motion.     Comments: Right foot: no obvious edema or deformity of toes. Tenderness to 2nd toe. Full ROM, increased pain with full flexion.   Skin:    General: Skin is warm and dry.     Capillary Refill: Capillary refill takes less than 2 seconds.     Findings:  Erythema present.     Comments: Right foot: mild erythema of 2nd and 3rd toes. Skin in tact. No red streaking.    Neurological:     Mental Status: She is alert and oriented to person, place, and time.     Sensory: No sensory deficit.  Psychiatric:        Behavior: Behavior normal.      UC Treatments / Results  Labs (all labs ordered are listed, but only abnormal results are displayed) Labs Reviewed - No data to display  EKG   Radiology DG Foot Complete Right  Result Date: 05/24/2020 CLINICAL DATA:  Painful right second toe for approximately 4 days. Taking colchicine for treatment, without relief. EXAM: RIGHT FOOT COMPLETE - 3+ VIEW COMPARISON:  None. FINDINGS: No fracture or bone lesion. Mild joint space narrowing at the first metatarsophalangeal joint. There is a small central subchondral erosion at the base of the great toe proximal phalanx with mild adjacent sclerosis. No marginal erosions. Remaining joints are normally spaced and aligned. Soft tissues are unremarkable. IMPRESSION: 1. No fracture or acute finding.  No bone lesion. 2. Mild arthropathic changes at the first metatarsophalangeal joint. 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 No other arthropathic changes. Electronically Signed   By: Lajean Manes M.D.   On: 05/24/2020 08:58    Procedures Procedures (including critical care time)  Medications Ordered in UC Medications - No data to display  Initial Impression / Assessment and Plan / UC Course  I have reviewed the triage vital signs and the nursing notes.  Pertinent labs & imaging results that were available during my care of the patient were reviewed by me and considered in my medical decision making (see chart for details).     Reviewed imaging with pt  Doubt gout Suspect sprain of her toe. Low concern for cellulitis at this time, however, due to slight redness, encouraged pt to monitor for worsening redness or development of  red streaking or swelling. Encouraged warm water and epson salt soaks. Toes strapped using buddy taping technique by Gibson Ramp, RN for comfort F/u with PCP or Sports Medicine in  1-2 weeks if not improving. AVS provided  Final Clinical Impressions(s) / UC Diagnoses   Final diagnoses:  Toe pain, right     Discharge Instructions      Tramadol is strong pain medication. While taking, do not drink alcohol, drive, or perform any other activities that requires focus while taking these medications.   You may take 500mg  acetaminophen every 4-6 hours or in combination with ibuprofen 400-600mg  every 6-8 hours as needed for pain and inflammation. You can also try soaking your foot in warm water and Epson salt and continue to buddy tape your toes as needed for comfort.  Please call to schedule a follow up appointment with Sports Medicine in 1-2 weeks if not improving.     ED Prescriptions    Medication Sig Dispense Auth. Provider   traMADol (ULTRAM) 50 MG tablet Take 1 tablet (50 mg total) by mouth every 6 (six) hours as needed. 10 tablet , PA-C     I have reviewed the PDMP during this encounter.   Lurene Shadow, Lurene Shadow 05/24/20 1158

## 2020-05-24 NOTE — Discharge Instructions (Signed)
  Tramadol is strong pain medication. While taking, do not drink alcohol, drive, or perform any other activities that requires focus while taking these medications.   You may take 500mg  acetaminophen every 4-6 hours or in combination with ibuprofen 400-600mg  every 6-8 hours as needed for pain and inflammation. You can also try soaking your foot in warm water and Epson salt and continue to buddy tape your toes as needed for comfort.  Please call to schedule a follow up appointment with Sports Medicine in 1-2 weeks if not improving.

## 2020-05-24 NOTE — ED Triage Notes (Signed)
Patient presents to Urgent Care with complaints of very painful right second toe since about 4 days ago. Patient reports she has been taking 0.6 of colchicine and it has not been working as well as it used to.  Pt denies injury to her toe, has had gout in the past.

## 2020-05-27 ENCOUNTER — Other Ambulatory Visit: Payer: Self-pay

## 2020-05-27 ENCOUNTER — Ambulatory Visit (INDEPENDENT_AMBULATORY_CARE_PROVIDER_SITE_OTHER): Payer: 59 | Admitting: Sports Medicine

## 2020-05-27 DIAGNOSIS — M109 Gout, unspecified: Secondary | ICD-10-CM | POA: Insufficient documentation

## 2020-05-27 DIAGNOSIS — M79671 Pain in right foot: Secondary | ICD-10-CM | POA: Diagnosis not present

## 2020-05-27 MED ORDER — HYDROCODONE-ACETAMINOPHEN 5-325 MG PO TABS: 1.0000 | ORAL_TABLET | Freq: Three times a day (TID) | ORAL | 0 refills | Status: DC | PRN

## 2020-05-27 MED ORDER — METHYLPREDNISOLONE SODIUM SUCC 125 MG IJ SOLR
125.0000 mg | Freq: Once | INTRAMUSCULAR | Status: AC
  Administered 2020-05-27: 125 mg via INTRAMUSCULAR

## 2020-05-27 NOTE — Patient Instructions (Signed)
Gout  Gout is a condition that causes painful swelling of the joints. Gout is a type of inflammation of the joints (arthritis). This condition is caused by having too much uric acid in the body. Uric acid is a chemical that forms when the body breaks down substances called purines. Purines are important for building body proteins. When the body has too much uric acid, sharp crystals can form and build up inside the joints. This causes pain and swelling. Gout attacks can happen quickly and may be very painful (acute gout). Over time, the attacks can affect more joints and become more frequent (chronic gout). Gout can also cause uric acid to build up under the skin and inside the kidneys. What are the causes? This condition is caused by too much uric acid in your blood. This can happen because:  Your kidneys do not remove enough uric acid from your blood. This is the most common cause.  Your body makes too much uric acid. This can happen with some cancers and cancer treatments. It can also occur if your body is breaking down too many red blood cells (hemolytic anemia).  You eat too many foods that are high in purines. These foods include organ meats and some seafood. Alcohol, especially beer, is also high in purines. A gout attack may be triggered by trauma or stress. What increases the risk? You are more likely to develop this condition if you:  Have a family history of gout.  Are female and middle-aged.  Are female and have gone through menopause.  Are obese.  Frequently drink alcohol, especially beer.  Are dehydrated.  Lose weight too quickly.  Have an organ transplant.  Have lead poisoning.  Take certain medicines, including aspirin, cyclosporine, diuretics, levodopa, and niacin.  Have kidney disease.  Have a skin condition called psoriasis. What are the signs or symptoms? An attack of acute gout happens quickly. It usually occurs in just one joint. The most common place is  the big toe. Attacks often start at night. Other joints that may be affected include joints of the feet, ankle, knee, fingers, wrist, or elbow. Symptoms of this condition may include:  Severe pain.  Warmth.  Swelling.  Stiffness.  Tenderness. The affected joint may be very painful to touch.  Shiny, red, or purple skin.  Chills and fever. Chronic gout may cause symptoms more frequently. More joints may be involved. You may also have white or yellow lumps (tophi) on your hands or feet or in other areas near your joints. How is this diagnosed? This condition is diagnosed based on your symptoms, medical history, and physical exam. You may have tests, such as:  Blood tests to measure uric acid levels.  Removal of joint fluid with a thin needle (aspiration) to look for uric acid crystals.  X-rays to look for joint damage. How is this treated? Treatment for this condition has two phases: treating an acute attack and preventing future attacks. Acute gout treatment may include medicines to reduce pain and swelling, including:  NSAIDs.  Steroids. These are strong anti-inflammatory medicines that can be taken by mouth (orally) or injected into a joint.  Colchicine. This medicine relieves pain and swelling when it is taken soon after an attack. It can be given by mouth or through an IV. Preventive treatment may include:  Daily use of smaller doses of NSAIDs or colchicine.  Use of a medicine that reduces uric acid levels in your blood. (allopurinol)  Changes to your diet. You  may need to see a dietitian about what to eat and drink to prevent gout. Follow these instructions at home: During a gout attack   If directed, put ice on the affected area: ? Put ice in a plastic bag. ? Place a towel between your skin and the bag. ? Leave the ice on for 20 minutes, 2-3 times a day.  Raise (elevate) the affected joint above the level of your heart as often as possible.  Rest the joint as  much as possible. If the affected joint is in your leg, you may be given crutches to use.  Follow instructions from your health care provider about eating or drinking restrictions. Avoiding future gout attacks  Follow a low-purine diet as told by your dietitian or health care provider. Avoid foods and drinks that are high in purines, including liver, kidney, anchovies, asparagus, herring, mushrooms, mussels, and beer.  Maintain a healthy weight or lose weight if you are overweight. If you want to lose weight, talk with your health care provider. It is important that you do not lose weight too quickly.  Start or maintain an exercise program as told by your health care provider. Eating and drinking  Drink enough fluids to keep your urine pale yellow.  If you drink alcohol: ? Limit how much you use to:  0-1 drink a day for women.  0-2 drinks a day for men. ? Be aware of how much alcohol is in your drink. In the [place] one drink equals one 12 oz bottle of beer (355 mL) one 5 oz glass of wine (148 mL), or one 1 oz glass of hard liquor (44 mL). General instructions  Take over-the-counter and prescription medicines only as told by your health care provider.  Do not drive or use heavy machinery while taking prescription pain medicine.  Return to your normal activities as told by your health care provider. Ask your health care provider what activities are safe for you.  Keep all follow-up visits as told by your health care provider. This is important. Contact a health care provider if you have:  Another gout attack.  Continuing symptoms of a gout attack after 10 days of treatment.  Side effects from your medicines.  Chills or a fever.  Burning pain when you urinate.  Pain in your lower back or belly. Get help right away if you:  Have severe or uncontrolled pain.  Cannot urinate. Summary  Gout is painful swelling of the joints caused by inflammation.  The most common site of  pain is the big toe, but it can affect other joints in the body.  Medicines and dietary changes can help to prevent and treat gout attacks. This information is not intended to replace advice given to you by your health care provider. Make sure you discuss any questions you have with your health care provider. Document Revised: 07/09/2018 Document Reviewed: 07/09/2018 Elsevier Patient Education  [place].

## 2020-05-27 NOTE — Addendum Note (Signed)
Addended by: Juel Burrow on: 05/27/2020 09:55 AM   Modules accepted: Orders

## 2020-05-27 NOTE — Progress Notes (Signed)
    Procedures performed today:    None.  Independent interpretation of notes and tests performed by another provider:   X-rays personally reviewed, she has significant MTP osteoarthritis.  Brief History, Exam, Impression, and Recommendations:    Right foot pain This is a very pleasant 69 year old female with suspected gout. 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 She saw an orthopedic surgeon and was given colchicine. This has historically worked but unfortunately she has these flares approximately every 6 weeks. She has never been offered preventative treatment. She has not had an arthrocentesis for confirmation of gout either and never had her uric acid levels checked. She is having severe pain in her right second MTP today, declines oral prednisone so we will do Solu-Medrol 125 intramuscular. Vicodin for pain, and she does have a postop shoe in the mail to use. I like to see her back in a month at which point we will likely check her uric acid levels and start allopurinol if greater than 5.    ___________________________________________ Ihor Austin. Benjamin Stain, M.D., ABFM., CAQSM. Primary Care and Sports Medicine [facility]  Adjunct Instructor of Family Medicine  [employer]

## 2020-05-27 NOTE — Assessment & Plan Note (Signed)
This is a very pleasant 69 year old female with suspected gout. 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 She saw an orthopedic surgeon and was given colchicine. This has historically worked but unfortunately she has these flares approximately every 6 weeks. She has never been offered preventative treatment. She has not had an arthrocentesis for confirmation of gout either and never had her uric acid levels checked. She is having severe pain in her right second MTP today, declines oral prednisone so we will do Solu-Medrol 125 intramuscular. Vicodin for pain, and she does have a postop shoe in the mail to use. I like to see her back in a month at which point we will likely check her uric acid levels and start allopurinol if greater than 5.

## 2020-06-23 ENCOUNTER — Ambulatory Visit (INDEPENDENT_AMBULATORY_CARE_PROVIDER_SITE_OTHER): Payer: 59 | Admitting: Sports Medicine

## 2020-06-23 DIAGNOSIS — M79671 Pain in right foot: Secondary | ICD-10-CM | POA: Diagnosis not present

## 2020-06-23 DIAGNOSIS — M1A0790 Idiopathic chronic gout, unspecified ankle and foot, without tophus (tophi): Secondary | ICD-10-CM | POA: Diagnosis not present

## 2020-06-23 LAB — COMPLETE METABOLIC PANEL WITH GFR
AG Ratio: 1.2 (calc) (ref 1.0–2.5)
ALT: 8 U/L (ref 6–29)
AST: 17 U/L (ref 10–35)
Albumin: 4.1 g/dL (ref 3.6–5.1)
Alkaline phosphatase (APISO): 65 U/L (ref 37–153)
BUN: 20 mg/dL (ref 7–25)
CO2: 34 mmol/L — ABNORMAL HIGH (ref 20–32)
Calcium: 10.2 mg/dL (ref 8.6–10.4)
Chloride: 100 mmol/L (ref 98–110)
Creat: 0.81 mg/dL (ref 0.50–0.99)
GFR, Est African American: 86 mL/min/{1.73_m2} (ref >=60)
GFR, Est Non African American: 75 mL/min/{1.73_m2} (ref >=60)
Globulin: 3.3 g/dL (calc) (ref 1.9–3.7)
Glucose, Bld: 80 mg/dL (ref 65–99)
Potassium: 4.6 mmol/L (ref 3.5–5.3)
Sodium: 139 mmol/L (ref 135–146)
Total Bilirubin: 0.8 mg/dL (ref 0.2–1.2)
Total Protein: 7.4 g/dL (ref 6.1–8.1)

## 2020-06-23 LAB — URIC ACID: Uric Acid, Serum: 9.8 mg/dL — ABNORMAL HIGH (ref 2.5–7.0)

## 2020-06-23 MED ORDER — COLCHICINE 0.6 MG PO TABS: 0.6000 mg | ORAL_TABLET | Freq: Two times a day (BID) | ORAL | 2 refills | Status: DC

## 2020-06-23 NOTE — Assessment & Plan Note (Addendum)
Kendra Proctor returns, she is a pleasant 69 year old female, she has had flares of what sounds to be podagra in the past with hot, red, swollen and painful toes, particularly first and second MTPs. At the last visit she had declined oral prednisone and injection so we did Solu-Medrol, Vicodin. She had good resolution of her right foot pain, since her last visit she did have an episode of podagra in her left foot. I do suspect this is gout though x-rays do show concurrent osteoarthritis. I am going to add uric acid levels today, colchicine. If uric acid levels are above 5 we will add uric acid lowering medication.  Uric acid levels are 9.8, they need to be below 5, adding allopurinol 300 mg twice a day with orders to recheck uric acid in 1 month, orders placed for everything.

## 2020-06-23 NOTE — Progress Notes (Addendum)
    Procedures performed today:    None.  Independent interpretation of notes and tests performed by another provider:   None.  Brief History, Exam, Impression, and Recommendations:    Gout of foot Kendra Proctor returns, she is a pleasant 69 year old female, she has had flares of what sounds to be podagra in the past with hot, red, swollen and painful toes, particularly first and second MTPs. At the last visit she had declined oral prednisone and injection so we did Solu-Medrol, Vicodin. She had good resolution of her right foot pain, since her last visit she did have an episode of podagra in her left foot. I do suspect this is gout though x-rays do show concurrent osteoarthritis. I am going to add uric acid levels today, colchicine. If uric acid levels are above 5 we will add uric acid lowering medication.  Uric acid levels are 9.8, they need to be below 5, adding allopurinol 300 mg twice a day with orders to recheck uric acid in 1 month, orders placed for everything.    ___________________________________________ Ihor Austin. Benjamin Stain, M.D., ABFM., CAQSM. Primary Care and Sports Medicine [facility]  Adjunct Instructor of Family Medicine  [employer]

## 2020-06-24 MED ORDER — ALLOPURINOL 300 MG PO TABS: 300.0000 mg | ORAL_TABLET | Freq: Two times a day (BID) | ORAL | 3 refills | Status: DC

## 2020-06-24 NOTE — Addendum Note (Signed)
Addended by: Monica Becton on: 06/24/2020 10:25 AM   Modules accepted: Orders

## 2020-06-24 NOTE — Telephone Encounter (Signed)
Please advise 

## 2020-06-25 NOTE — Telephone Encounter (Signed)
See my result note for instructions on how to manage the uric acid levels.

## 2020-07-14 ENCOUNTER — Other Ambulatory Visit: Payer: Self-pay | Admitting: Family Medicine

## 2020-07-14 DIAGNOSIS — Z1231 Encounter for screening mammogram for malignant neoplasm of breast: Secondary | ICD-10-CM

## 2020-07-21 ENCOUNTER — Other Ambulatory Visit: Payer: Self-pay

## 2020-07-21 ENCOUNTER — Ambulatory Visit (INDEPENDENT_AMBULATORY_CARE_PROVIDER_SITE_OTHER): Payer: 59

## 2020-07-21 DIAGNOSIS — Z1231 Encounter for screening mammogram for malignant neoplasm of breast: Secondary | ICD-10-CM

## 2021-01-31 ENCOUNTER — Other Ambulatory Visit: Payer: Self-pay | Admitting: Sports Medicine

## 2021-01-31 DIAGNOSIS — M1A0790 Idiopathic chronic gout, unspecified ankle and foot, without tophus (tophi): Secondary | ICD-10-CM

## 2021-01-31 DIAGNOSIS — M79671 Pain in right foot: Secondary | ICD-10-CM

## 2021-04-11 ENCOUNTER — Other Ambulatory Visit: Payer: Self-pay | Admitting: Sports Medicine

## 2021-04-11 DIAGNOSIS — M79671 Pain in right foot: Secondary | ICD-10-CM

## 2021-04-11 DIAGNOSIS — M1A0790 Idiopathic chronic gout, unspecified ankle and foot, without tophus (tophi): Secondary | ICD-10-CM

## 2021-04-13 IMAGING — DX DG FOOT COMPLETE 3+V*R*
3 series · 3 of 3 positions shown · non-contrast
Comparison: None.

CLINICAL DATA: Painful right second toe for approximately 4 days.
Taking colchicine for treatment, without relief.

EXAM:
RIGHT FOOT COMPLETE - 3+ VIEW

[foot ap]
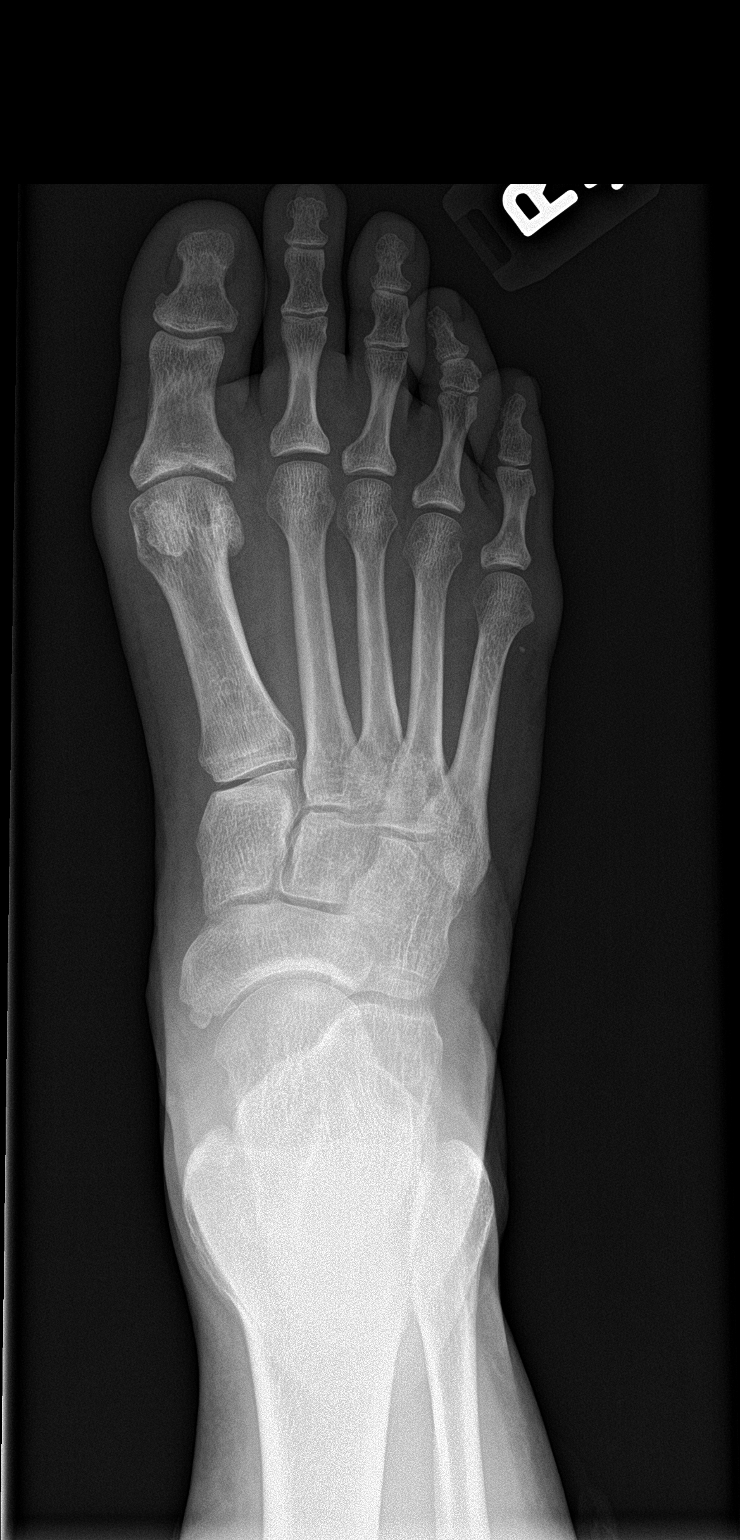

[foot obl]
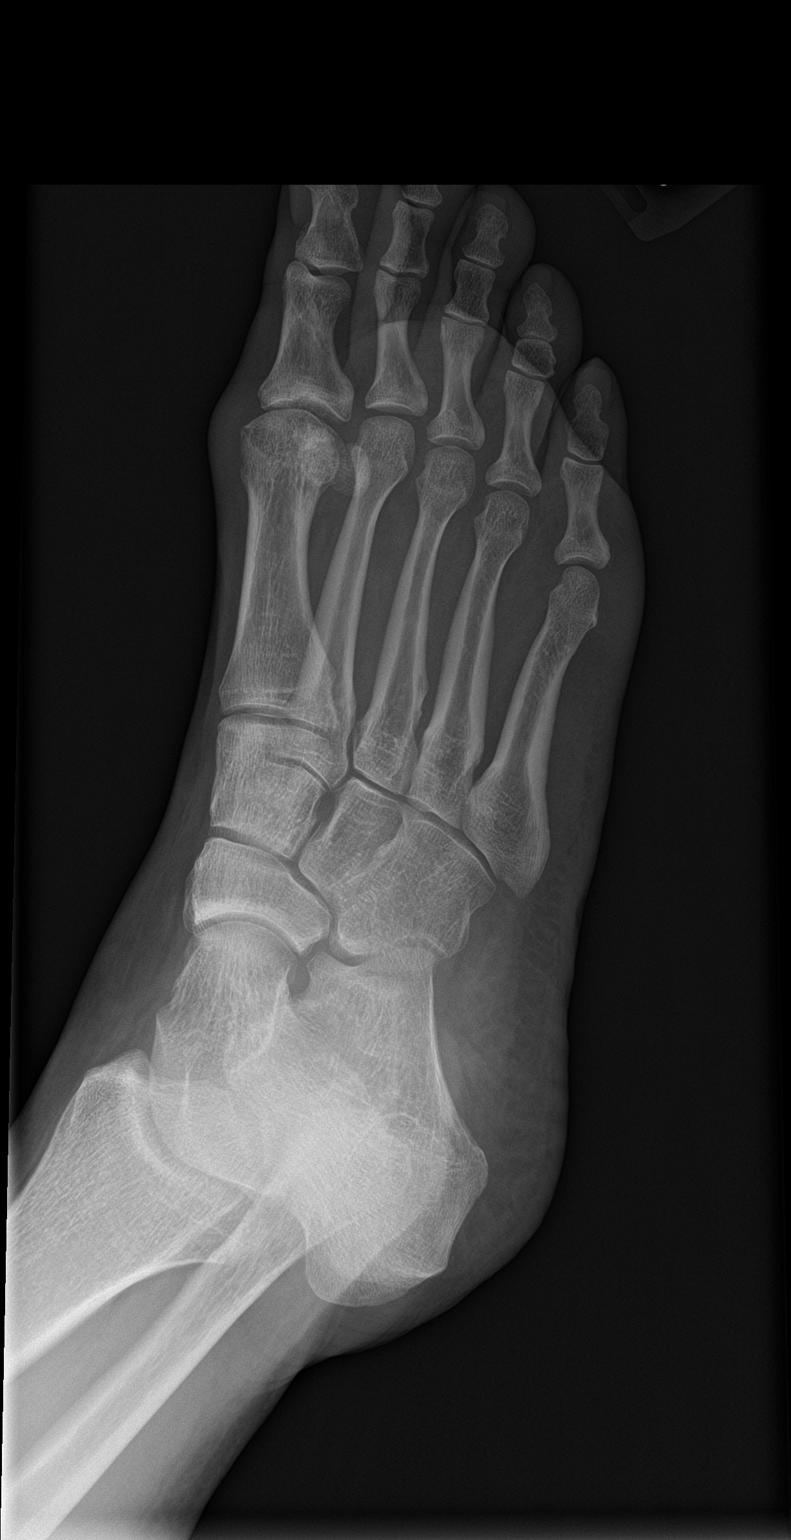

[foot lat]
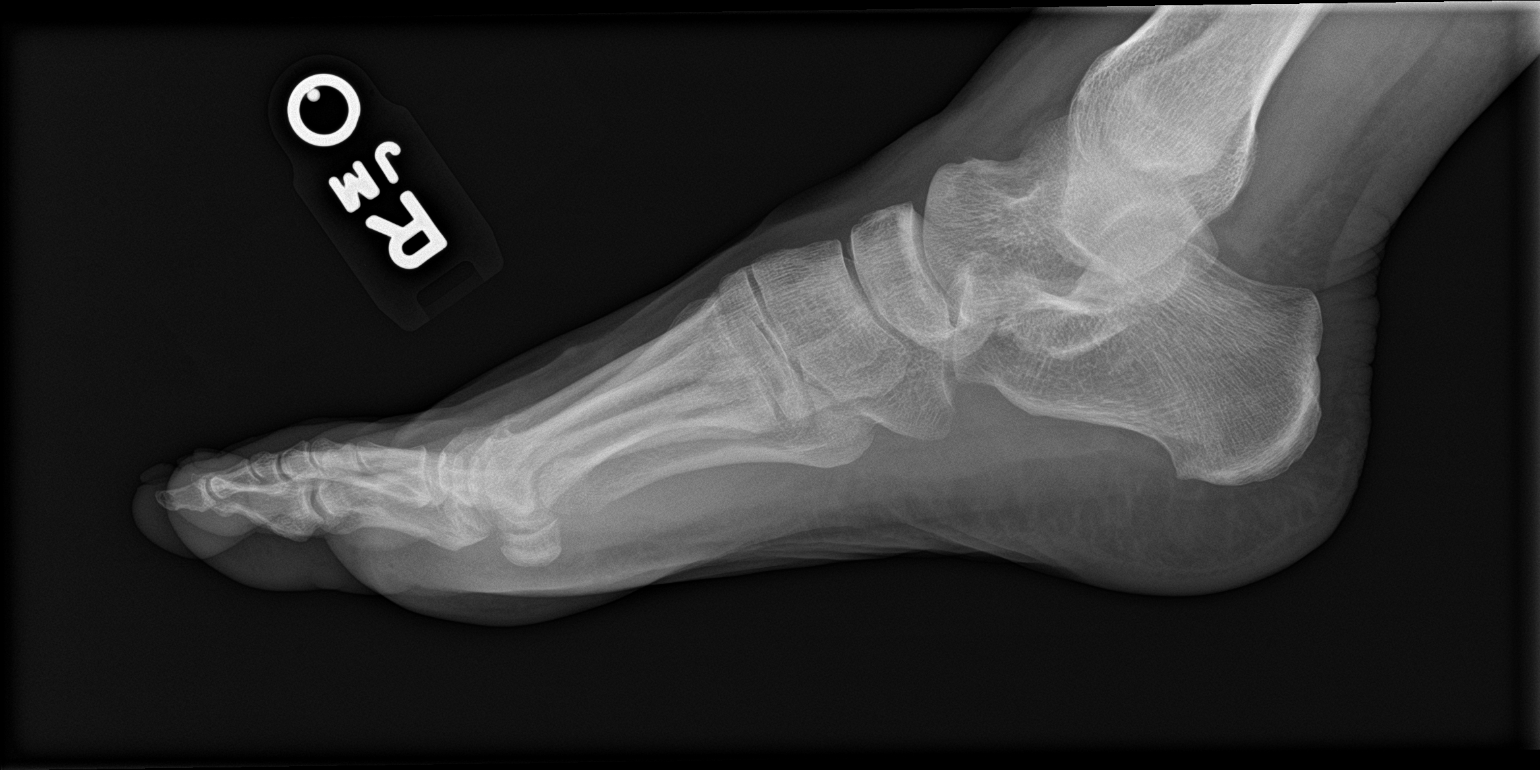

[3 of 3 positions shown; findings below may reference images not displayed]

FINDINGS: No fracture or bone lesion.

Mild joint space narrowing at the first metatarsophalangeal joint.
There is a small central subchondral erosion at the base of the
great toe proximal phalanx with mild adjacent sclerosis. No marginal
erosions.

Remaining joints are normally spaced and aligned.

Soft tissues are unremarkable.
IMPRESSION: 1. No fracture or acute finding.  No bone lesion.
2. Mild arthropathic changes at the first metatarsophalangeal joint.
Pattern suggests osteoarthritis as opposed to an inflammatory
arthropathy. No other arthropathic changes.

## 2021-07-12 ENCOUNTER — Other Ambulatory Visit: Payer: Self-pay | Admitting: Family Medicine

## 2021-07-12 DIAGNOSIS — Z1231 Encounter for screening mammogram for malignant neoplasm of breast: Secondary | ICD-10-CM

## 2021-07-26 ENCOUNTER — Other Ambulatory Visit: Payer: Self-pay

## 2021-07-26 ENCOUNTER — Ambulatory Visit (INDEPENDENT_AMBULATORY_CARE_PROVIDER_SITE_OTHER): Payer: 59

## 2021-07-26 DIAGNOSIS — Z1231 Encounter for screening mammogram for malignant neoplasm of breast: Secondary | ICD-10-CM | POA: Diagnosis not present

## 2022-03-05 ENCOUNTER — Other Ambulatory Visit: Payer: Self-pay | Admitting: Family Medicine

## 2022-03-05 DIAGNOSIS — R911 Solitary pulmonary nodule: Secondary | ICD-10-CM

## 2022-03-08 DIAGNOSIS — I480 Paroxysmal atrial fibrillation: Secondary | ICD-10-CM | POA: Insufficient documentation

## 2022-08-06 ENCOUNTER — Ambulatory Visit (INDEPENDENT_AMBULATORY_CARE_PROVIDER_SITE_OTHER): Payer: 59

## 2022-08-06 ENCOUNTER — Other Ambulatory Visit: Payer: Self-pay | Admitting: Family Medicine

## 2022-08-06 DIAGNOSIS — R911 Solitary pulmonary nodule: Secondary | ICD-10-CM

## 2022-08-06 DIAGNOSIS — Z1231 Encounter for screening mammogram for malignant neoplasm of breast: Secondary | ICD-10-CM

## 2022-08-09 ENCOUNTER — Ambulatory Visit (INDEPENDENT_AMBULATORY_CARE_PROVIDER_SITE_OTHER): Payer: 59

## 2022-08-09 DIAGNOSIS — Z1231 Encounter for screening mammogram for malignant neoplasm of breast: Secondary | ICD-10-CM | POA: Diagnosis not present

## 2022-11-30 DIAGNOSIS — D126 Benign neoplasm of colon, unspecified: Secondary | ICD-10-CM | POA: Insufficient documentation

## 2023-01-29 ENCOUNTER — Other Ambulatory Visit: Payer: Self-pay | Admitting: Family Medicine

## 2023-01-29 DIAGNOSIS — R918 Other nonspecific abnormal finding of lung field: Secondary | ICD-10-CM

## 2023-01-31 ENCOUNTER — Ambulatory Visit (INDEPENDENT_AMBULATORY_CARE_PROVIDER_SITE_OTHER): Payer: 59

## 2023-01-31 DIAGNOSIS — R918 Other nonspecific abnormal finding of lung field: Secondary | ICD-10-CM

## 2023-06-07 ENCOUNTER — Other Ambulatory Visit: Payer: Self-pay | Admitting: Family Medicine

## 2023-06-07 DIAGNOSIS — M85852 Other specified disorders of bone density and structure, left thigh: Secondary | ICD-10-CM

## 2023-07-03 ENCOUNTER — Ambulatory Visit (INDEPENDENT_AMBULATORY_CARE_PROVIDER_SITE_OTHER): Payer: 59

## 2023-07-03 DIAGNOSIS — M85852 Other specified disorders of bone density and structure, left thigh: Secondary | ICD-10-CM

## 2023-07-03 DIAGNOSIS — Z780 Asymptomatic menopausal state: Secondary | ICD-10-CM

## 2023-08-23 ENCOUNTER — Telehealth: Payer: Self-pay | Admitting: Physician Assistant

## 2023-08-23 NOTE — Telephone Encounter (Signed)
Yes

## 2023-08-23 NOTE — Telephone Encounter (Signed)
Patient called and ask if you would take her as a new patient you see her niece and nephew  [phone]

## 2023-08-30 ENCOUNTER — Encounter: Payer: Self-pay | Admitting: Physician Assistant

## 2023-08-30 ENCOUNTER — Ambulatory Visit: Payer: 59 | Admitting: Physician Assistant

## 2023-08-30 VITALS — BP 117/84 | HR 82 | Ht 63.0 in | Wt 180.1 lb

## 2023-08-30 DIAGNOSIS — E66811 Obesity, class 1: Secondary | ICD-10-CM | POA: Insufficient documentation

## 2023-08-30 DIAGNOSIS — E785 Hyperlipidemia, unspecified: Secondary | ICD-10-CM | POA: Insufficient documentation

## 2023-08-30 DIAGNOSIS — Z23 Encounter for immunization: Secondary | ICD-10-CM | POA: Diagnosis not present

## 2023-08-30 DIAGNOSIS — E782 Mixed hyperlipidemia: Secondary | ICD-10-CM

## 2023-08-30 DIAGNOSIS — I10 Essential (primary) hypertension: Secondary | ICD-10-CM

## 2023-08-30 DIAGNOSIS — I480 Paroxysmal atrial fibrillation: Secondary | ICD-10-CM

## 2023-08-30 DIAGNOSIS — E6609 Other obesity due to excess calories: Secondary | ICD-10-CM

## 2023-08-30 DIAGNOSIS — Z8739 Personal history of other diseases of the musculoskeletal system and connective tissue: Secondary | ICD-10-CM

## 2023-08-30 DIAGNOSIS — Z79899 Other long term (current) drug therapy: Secondary | ICD-10-CM

## 2023-08-30 DIAGNOSIS — Z6831 Body mass index (BMI) 31.0-31.9, adult: Secondary | ICD-10-CM

## 2023-08-30 MED ORDER — COLCHICINE 0.6 MG PO TABS
0.6000 mg | ORAL_TABLET | Freq: Two times a day (BID) | ORAL | 0 refills | Status: AC
Start: 2023-08-30 — End: ?

## 2023-08-30 MED ORDER — MOUNJARO 2.5 MG/0.5ML ~~LOC~~ SOAJ: 2.5000 mg | SUBCUTANEOUS | 0 refills | Status: DC

## 2023-08-30 NOTE — Patient Instructions (Signed)
Start [employer] walking regularly

## 2023-08-30 NOTE — Progress Notes (Signed)
New Patient Office Visit  Subjective    Patient ID: Kendra Proctor, female    DOB: [birth year]  Age: 72 y.o. MRN: [id number]  CC:  Chief Complaint  Patient presents with   New Patient (Initial Visit)    Establish care    HPI Kendra Proctor presents to establish care. She was not happy with prior PCP. She is doing fairly well today. No need for refills. She would like to discuss weight. She has HTN, PAF, HLD and knows for her health needs to lose weight. She is trying to walk some but her heart rate does keep her limited somewhat.    Outpatient Encounter Medications as of 08/30/2023  Medication Sig   allopurinol (ZYLOPRIM) 300 MG tablet Take 1 tablet by mouth twice daily   Ascorbic Acid (VITAMIN C PO) Take by mouth.   aspirin EC 81 MG tablet Take 81 mg by mouth daily.   atorvastatin (LIPITOR) 20 MG tablet Take 20 mg by mouth daily.   Cranberry Juice Extract 1000 MG CAPS Take by mouth.   lisinopril (ZESTRIL) 10 MG tablet Take 10 mg by mouth daily.   metoprolol tartrate (LOPRESSOR) 100 MG tablet Take 100 mg by mouth 2 (two) times daily.   tirzepatide [facility]) 2.5 MG/0.5ML Pen Inject 2.5 mg into the skin once a week.   VITAMIN D, CHOLECALCIFEROL, PO Take by mouth.   [DISCONTINUED] colchicine 0.6 MG tablet Take 1 tablet (0.6 mg total) by mouth 2 (two) times daily.   [DISCONTINUED] polyethylene glycol powder (GLYCOLAX/MIRALAX) 17 GM/SCOOP powder Take 1 Container by mouth daily.   [DISCONTINUED] simvastatin (ZOCOR) 20 MG tablet Take 1 tablet by mouth at bedtime.   [DISCONTINUED] Topiramate ER (TROKENDI XR) 50 MG CP24 Take 1 capsule by mouth daily.   colchicine 0.6 MG tablet Take 1 tablet (0.6 mg total) by mouth 2 (two) times daily.   [DISCONTINUED] lisinopril (PRINIVIL,ZESTRIL) 5 MG tablet Take 5 mg by mouth daily.   [DISCONTINUED] lisinopril-hydrochlorothiazide (ZESTORETIC) 20-25 MG tablet Take 1 tablet by mouth daily.   [DISCONTINUED] simvastatin (ZOCOR) 20 MG tablet Take 20 mg by  mouth at bedtime.   No facility-administered encounter medications on file as of 08/30/2023.    Past Medical History:  Diagnosis Date   Coronary artery disease    High cholesterol    Hypertension     Past Surgical History:  Procedure Laterality Date   BREAST EXCISIONAL BIOPSY     KNEE SURGERY     TONSILLECTOMY      Family History  Problem Relation Age of Onset   Cancer Mother    Stroke Father     Social History   Socioeconomic History   Marital status: Single    Spouse name: Not on file   Number of children: Not on file   Years of education: Not on file   Highest education level: Not on file  Occupational History   Not on file  Tobacco Use   Smoking status: Former    Current packs/day: 0.00    Types: Cigarettes    Quit date: 06/17/2014    Years since quitting: 9.2   Smokeless tobacco: Never  Vaping Use   Vaping status: Some Days   Substances: Nicotine, Flavoring  Substance and Sexual Activity   Alcohol use: Yes    Comment: Occasional   Drug use: No   Sexual activity: Not on file  Other Topics Concern   Not on file  Social History Narrative   Not on file  Social Determinants of Health   Financial Resource Strain: Patient Declined (05/20/2023)   Received from [facility], [facility]   Overall Financial Resource Strain (CARDIA)    Difficulty of Paying Living Expenses: Patient declined  Food Insecurity: Patient Declined (05/20/2023)   Received from [facility], [facility]   Hunger Vital Sign    Worried About Running Out of Food in the Last Year: Patient declined    Ran Out of Food in the Last Year: Patient declined  Transportation Needs: Patient Declined (05/20/2023)   Received from [employer], [facility]   PRAPARE - Transportation    Lack of Transportation (Medical): Patient declined    Lack of Transportation (Non-Medical): Patient declined  Physical Activity: Inactive (05/20/2023)   Received from [facility], [facility]   Exercise  Vital Sign    Days of Exercise per Week: 0 days    Minutes of Exercise per Session: 10 min  Stress: Patient Declined (05/20/2023)   Received from [facility], [facility]   [employer] of Occupational Health - Occupational Stress Questionnaire    Feeling of Stress : Patient declined  Social Connections: Somewhat Isolated (05/20/2023)   Received from [facility], [facility]   Social Network    How would you rate your social network (family, work, friends)?: Restricted participation with some degree of social isolation  Intimate Partner Violence: Not At Risk (05/20/2023)   Received from [facility], [facility]   HITS    Over the last 12 months how often did your partner physically hurt you?: 1    Over the last 12 months how often did your partner insult you or talk down to you?: 1    Over the last 12 months how often did your partner threaten you with physical harm?: 1    Over the last 12 months how often did your partner scream or curse at you?: 1    ROS   See HPI.    Objective    BP 117/84   Pulse 82   Ht 5\' 3"  (1.6 m)   Wt 180 lb 1.9 oz (81.7 kg)   SpO2 98%   BMI 31.91 kg/m   Physical Exam Constitutional:      Appearance: Normal appearance. She is obese.  HENT:     Head: Normocephalic.  Cardiovascular:     Rate and Rhythm: Rhythm irregular.  Pulmonary:     Effort: Pulmonary effort is normal.     Breath sounds: Normal breath sounds.  Musculoskeletal:     Cervical back: Normal range of motion and neck supple.     Right lower leg: No edema.     Left lower leg: No edema.  Lymphadenopathy:     Cervical: No cervical adenopathy.  Neurological:     General: No focal deficit present.     Mental Status: She is alert and oriented to person, place, and time.  Psychiatric:        Mood and Affect: Mood normal.         Assessment & Plan:  Marland KitchenMarland KitchenBert was seen today for new patient (initial visit).  Diagnoses and all orders for this visit:  PAF  (paroxysmal atrial fibrillation) (HCC) -     tirzepatide (MOUNJARO) 2.5 MG/0.5ML Pen; Inject 2.5 mg into the skin once a week.  Class 1 obesity due to excess calories without serious comorbidity with body mass index (BMI) of 31.0 to 31.9 in adult -     tirzepatide [facility]) 2.5 MG/0.5ML Pen; Inject  2.5 mg into the skin once a week.  Primary hypertension -     CMP14+EGFR -     tirzepatide (MOUNJARO) 2.5 MG/0.5ML Pen; Inject 2.5 mg into the skin once a week.  Mixed hyperlipidemia -     CMP14+EGFR -     tirzepatide (MOUNJARO) 2.5 MG/0.5ML Pen; Inject 2.5 mg into the skin once a week.  History of gout -     colchicine 0.6 MG tablet; Take 1 tablet (0.6 mg total) by mouth 2 (two) times daily.  Medication management -     CMP14+EGFR  Encounter for immunization -     Flu Vaccine Trivalent High Dose (Fluad)   Flu shot given today and per patient all other vaccines given we just need to find where and document  .Marland KitchenDiscussed low carb diet with 1500 calories and 80g of protein.  Exercising at least 150 minutes a week.  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  Pt is high risk for CV event with HTN, HLD and PAF Needs to work on weight loss Failed trokendi/wellbutrin Trial of mounjaro  Discussed side effects and titration up to 5mg   Follow up in 3 months  Labs done 05/2023 CMP ordered to have done in next 3 months   Return in about 3 months (around 11/30/2023) for weight and BP follow up .   Tandy Gaw, PA-C

## 2023-09-23 ENCOUNTER — Other Ambulatory Visit: Payer: Self-pay | Admitting: Physician Assistant

## 2023-09-23 DIAGNOSIS — I480 Paroxysmal atrial fibrillation: Secondary | ICD-10-CM

## 2023-09-23 DIAGNOSIS — E782 Mixed hyperlipidemia: Secondary | ICD-10-CM

## 2023-09-23 DIAGNOSIS — I10 Essential (primary) hypertension: Secondary | ICD-10-CM

## 2023-09-23 DIAGNOSIS — E6609 Other obesity due to excess calories: Secondary | ICD-10-CM

## 2023-09-23 MED ORDER — TIRZEPATIDE 5 MG/0.5ML ~~LOC~~ SOAJ: 5.0000 mg | SUBCUTANEOUS | 0 refills | Status: DC

## 2023-09-23 NOTE — Telephone Encounter (Signed)
Patient informed of strength increase and prescription sent to pharmacy.

## 2023-09-23 NOTE — Telephone Encounter (Signed)
Requesting refill for mounjaro Last on 2.5mg  Increase dose  to 5mg  ? Last written as 2.5mg   on 08/30/2023 Last OV 08/30/2023 Upcoming appt schld 12/02/2023

## 2023-10-13 ENCOUNTER — Encounter: Payer: Self-pay | Admitting: Physician Assistant

## 2023-10-13 DIAGNOSIS — M1A0790 Idiopathic chronic gout, unspecified ankle and foot, without tophus (tophi): Secondary | ICD-10-CM

## 2023-10-13 DIAGNOSIS — M79671 Pain in right foot: Secondary | ICD-10-CM

## 2023-10-14 MED ORDER — ONDANSETRON 8 MG PO TBDP
8.0000 mg | ORAL_TABLET | Freq: Three times a day (TID) | ORAL | 1 refills | Status: AC | PRN
Start: 2023-10-14 — End: ?

## 2023-10-15 MED ORDER — ALLOPURINOL 300 MG PO TABS
300.0000 mg | ORAL_TABLET | Freq: Two times a day (BID) | ORAL | 1 refills | Status: DC
Start: 2023-10-15 — End: 2024-03-24

## 2023-10-15 NOTE — Addendum Note (Signed)
Addended by: Chalmers Cater on: 10/15/2023 08:52 AM   Modules accepted: Orders

## 2023-10-15 NOTE — Addendum Note (Signed)
Addended by: Jomarie Longs on: 10/15/2023 09:53 AM   Modules accepted: Orders

## 2023-10-21 ENCOUNTER — Other Ambulatory Visit: Payer: Self-pay | Admitting: Physician Assistant

## 2023-10-21 DIAGNOSIS — Z1231 Encounter for screening mammogram for malignant neoplasm of breast: Secondary | ICD-10-CM

## 2023-10-21 MED ORDER — TIRZEPATIDE 5 MG/0.5ML ~~LOC~~ SOAJ: 5.0000 mg | SUBCUTANEOUS | 0 refills | Status: DC

## 2023-10-21 NOTE — Addendum Note (Signed)
Addended by: Chalmers Cater on: 10/21/2023 07:49 AM   Modules accepted: Orders

## 2023-10-25 ENCOUNTER — Encounter: Payer: Self-pay | Admitting: Physician Assistant

## 2023-10-28 ENCOUNTER — Ambulatory Visit: Payer: 59 | Admitting: Physician Assistant

## 2023-10-28 ENCOUNTER — Encounter: Payer: Self-pay | Admitting: Physician Assistant

## 2023-10-28 VITALS — BP 135/76 | HR 75 | Ht 63.0 in | Wt 168.1 lb

## 2023-10-28 DIAGNOSIS — Z23 Encounter for immunization: Secondary | ICD-10-CM | POA: Diagnosis not present

## 2023-10-28 DIAGNOSIS — R300 Dysuria: Secondary | ICD-10-CM | POA: Diagnosis not present

## 2023-10-28 LAB — POCT URINALYSIS DIP (CLINITEK)
Bilirubin, UA: NEGATIVE
Blood, UA: NEGATIVE
Glucose, UA: NEGATIVE mg/dL
Ketones, POC UA: NEGATIVE mg/dL
Nitrite, UA: NEGATIVE
POC PROTEIN,UA: NEGATIVE
Spec Grav, UA: 1.015 (ref 1.010–1.025)
Urobilinogen, UA: 0.2 U/dL
pH, UA: 7 (ref 5.0–8.0)

## 2023-10-28 NOTE — Progress Notes (Unsigned)
Acute Office Visit  Subjective:     Patient ID: Kendra Proctor, female    DOB: 1951/07/26, 72 y.o.   MRN: [id number]  Chief Complaint  Patient presents with   possible UTI    C/o  urinary pressure and slight burning sensation x Friday 10/24/24. Used Azo defense.     HPI Patient is in today for dysuria for 4 days. It started Friday and she went to pharmacy and started Azo defense. Her symptoms went away. No fever, chills, nausea, vomiting, flank pain, diarrhea, constipation. She has not had UTI in over 20 years. She denies any known reason for one to occur.   .. Active Ambulatory Problems    Diagnosis Date Noted   Gout of foot 05/27/2020   Acquired hallux rigidus of left foot 05/22/2019   PAF (paroxysmal atrial fibrillation) (HCC) 03/08/2022   Hypertension 08/30/2023   Hyperlipidemia 08/30/2023   Osteopenia of left hip 11/29/2017   Seborrheic dermatitis of scalp 11/02/2016   Tubular adenoma of colon 11/30/2022   Class 1 obesity due to excess calories without serious comorbidity with body mass index (BMI) of 31.0 to 31.9 in adult 08/30/2023   History of gout 08/30/2023   Resolved Ambulatory Problems    Diagnosis Date Noted   No Resolved Ambulatory Problems   Past Medical History:  Diagnosis Date   Colon polyp    Coronary artery disease    High cholesterol     ROS See HPI.      Objective:    BP 135/76   Pulse 75   Ht 5\' 3"  (1.6 m)   Wt 168 lb 2 oz (76.3 kg)   SpO2 99%   BMI 29.78 kg/m  BP Readings from Last 3 Encounters:  10/28/23 135/76  08/30/23 117/84  05/24/20 116/80   Wt Readings from Last 3 Encounters:  10/28/23 168 lb 2 oz (76.3 kg)  08/30/23 180 lb 1.9 oz (81.7 kg)  01/04/18 160 lb (72.6 kg)     .Marland Kitchen Results for orders placed or performed in visit on 10/28/23  POCT URINALYSIS DIP (CLINITEK)  Result Value Ref Range   Color, UA yellow yellow   Clarity, UA clear clear   Glucose, UA negative negative mg/dL   Bilirubin, UA negative negative    Ketones, POC UA negative negative mg/dL   Spec Grav, UA 3.086 5.784 - 1.025   Blood, UA negative negative   pH, UA 7.0 5.0 - 8.0   POC PROTEIN,UA negative negative, trace   Urobilinogen, UA 0.2 0.2 or 1.0 E.U./dL   Nitrite, UA Negative Negative   Leukocytes, UA Small (1+) (A) Negative      Physical Exam Constitutional:      Appearance: Normal appearance.  HENT:     Head: Normocephalic.  Cardiovascular:     Rate and Rhythm: Normal rate and regular rhythm.     Heart sounds: Normal heart sounds.  Pulmonary:     Effort: Pulmonary effort is normal.     Breath sounds: Normal breath sounds.  Abdominal:     General: There is no distension.     Palpations: There is no mass.     Tenderness: There is no abdominal tenderness. There is no right CVA tenderness, left CVA tenderness, guarding or rebound.     Hernia: No hernia is present.  Neurological:     General: No focal deficit present.     Mental Status: She is alert and oriented to person, place, and time.  Psychiatric:  Mood and Affect: Mood normal.           Assessment & Plan:  Marland KitchenMarland KitchenGelinda was seen today for possible uti.  Diagnoses and all orders for this visit:  Dysuria -     POCT URINALYSIS DIP (CLINITEK) -     Urine Culture  Encounter for immunization -     Pfizer Comirnaty Covid-19 Vaccine 57yrs & older   UA positive for small leuks only Will culture Continue to treat with good hydration Pt is not symptomatic today Follow up as needed Covid vaccine given today   Tandy Gaw, PA-C

## 2023-10-28 NOTE — Patient Instructions (Signed)
Urinary Tract Infection, Adult A urinary tract infection (UTI) is an infection of any part of the urinary tract. The urinary tract includes: The kidneys. The ureters. The bladder. The urethra. These organs make, store, and get rid of pee (urine) in the body. What are the causes? This infection is caused by germs (bacteria) in your genital area. These germs grow and cause swelling (inflammation) of your urinary tract. What increases the risk? The following factors may make you more likely to develop this condition: Using a small, thin tube (catheter) to drain pee. Not being able to control when you pee or poop (incontinence). Being female. If you are female, these things can increase the risk: Using these methods to prevent pregnancy: A medicine that kills sperm (spermicide). A device that blocks sperm (diaphragm). Having low levels of a female hormone (estrogen). Being pregnant. You are more likely to develop this condition if: You have genes that add to your risk. You are sexually active. You take antibiotic medicines. You have trouble peeing because of: A prostate that is bigger than normal, if you are female. A blockage in the part of your body that drains pee from the bladder. A kidney stone. A nerve condition that affects your bladder. Not getting enough to drink. Not peeing often enough. You have other conditions, such as: Diabetes. A weak disease-fighting system (immune system). Sickle cell disease. Gout. Injury of the spine. What are the signs or symptoms? Symptoms of this condition include: Needing to pee right away. Peeing small amounts often. Pain or burning when peeing. Blood in the pee. Pee that smells bad or not like normal. Trouble peeing. Pee that is cloudy. Fluid coming from the vagina, if you are female. Pain in the belly or lower back. Other symptoms include: Vomiting. Not feeling hungry. Feeling mixed up (confused). This may be the first symptom in  older adults. Being tired and grouchy (irritable). A fever. Watery poop (diarrhea). How is this treated? Taking antibiotic medicine. Taking other medicines. Drinking enough water. In some cases, you may need to see a specialist. Follow these instructions at home:  Medicines Take over-the-counter and prescription medicines only as told by your doctor. If you were prescribed an antibiotic medicine, take it as told by your doctor. Do not stop taking it even if you start to feel better. General instructions Make sure you: Pee until your bladder is empty. Do not hold pee for a long time. Empty your bladder after sex. Wipe from front to back after peeing or pooping if you are a female. Use each tissue one time when you wipe. Drink enough fluid to keep your pee pale yellow. Keep all follow-up visits. Contact a doctor if: You do not get better after 1-2 days. Your symptoms go away and then come back. Get help right away if: You have very bad back pain. You have very bad pain in your lower belly. You have a fever. You have chills. You feeling like you will vomit or you vomit. Summary A urinary tract infection (UTI) is an infection of any part of the urinary tract. This condition is caused by germs in your genital area. There are many risk factors for a UTI. Treatment includes antibiotic medicines. Drink enough fluid to keep your pee pale yellow. This information is not intended to replace advice given to you by your health care provider. Make sure you discuss any questions you have with your health care provider. Document Revised: 07/24/2020 Document Reviewed: 07/29/2020 Elsevier Patient Education    2024 Elsevier Inc.  

## 2023-10-29 ENCOUNTER — Encounter: Payer: Self-pay | Admitting: Physician Assistant

## 2023-10-30 LAB — URINE CULTURE

## 2023-11-01 NOTE — Progress Notes (Signed)
No significant bacteria found in urine culture.

## 2023-11-26 LAB — CMP14+EGFR
ALT: 12 [IU]/L (ref 0–32)
AST: 24 [IU]/L (ref 0–40)
Albumin: 3.8 g/dL (ref 3.8–4.8)
Alkaline Phosphatase: 84 [IU]/L (ref 44–121)
BUN/Creatinine Ratio: 21 (ref 12–28)
BUN: 18 mg/dL (ref 8–27)
Bilirubin Total: 0.7 mg/dL (ref 0.0–1.2)
CO2: 25 mmol/L (ref 20–29)
Calcium: 9.3 mg/dL (ref 8.7–10.3)
Chloride: 99 mmol/L (ref 96–106)
Creatinine, Ser: 0.84 mg/dL (ref 0.57–1.00)
Globulin, Total: 2.8 g/dL (ref 1.5–4.5)
Glucose: 77 mg/dL (ref 70–99)
Potassium: 4.7 mmol/L (ref 3.5–5.2)
Sodium: 139 mmol/L (ref 134–144)
Total Protein: 6.6 g/dL (ref 6.0–8.5)
eGFR: 74 mL/min/{1.73_m2} (ref >59)

## 2023-11-26 NOTE — Progress Notes (Signed)
Kendra Proctor,   Kidney, liver, glucose look good.

## 2023-12-02 ENCOUNTER — Ambulatory Visit: Payer: 59 | Admitting: Physician Assistant

## 2023-12-04 ENCOUNTER — Ambulatory Visit: Payer: 59

## 2023-12-09 ENCOUNTER — Ambulatory Visit: Payer: 59 | Admitting: Physician Assistant

## 2023-12-10 ENCOUNTER — Encounter: Payer: Self-pay | Admitting: Physician Assistant

## 2023-12-10 ENCOUNTER — Ambulatory Visit: Payer: 59 | Admitting: Physician Assistant

## 2023-12-10 VITALS — BP 132/94 | HR 80 | Resp 14 | Ht 63.0 in | Wt 161.4 lb

## 2023-12-10 DIAGNOSIS — E663 Overweight: Secondary | ICD-10-CM | POA: Diagnosis not present

## 2023-12-10 DIAGNOSIS — I480 Paroxysmal atrial fibrillation: Secondary | ICD-10-CM

## 2023-12-10 DIAGNOSIS — I10 Essential (primary) hypertension: Secondary | ICD-10-CM

## 2023-12-10 DIAGNOSIS — K5903 Drug induced constipation: Secondary | ICD-10-CM

## 2023-12-10 DIAGNOSIS — R112 Nausea with vomiting, unspecified: Secondary | ICD-10-CM | POA: Insufficient documentation

## 2023-12-10 DIAGNOSIS — Z79899 Other long term (current) drug therapy: Secondary | ICD-10-CM

## 2023-12-10 NOTE — Progress Notes (Signed)
Established Patient Office Visit  Subjective   Patient ID: Kendra Proctor, female    DOB: 19-Sep-1951  Age: 72 y.o. MRN: [id number]  Chief Complaint  Patient presents with   Hypertension   Weight Loss    Follow up    HPI 72 yo female presents today for BP and weight 3 mo f/u. She is currently on Mounjaro, which causes constipation and nausea. She didn't have a BM for 9 days. She took Doculax for constipation, but still took her 3 days to have a BM. She has Zofran which is not working. She hasn't taken [facility] in a wk. She has not felt nauseous since stopping. She was on [facility] for 2 months and has lost ~20 lbs. She has tried Topamax in the past and did not think it worked well. She also used Wellbutrin in the past and became violent. She has stopped mounjaro for 2 weeks and already has no nausea and feeling better.   BP elevated today. She says her BP in past has not been that high. She is on lisinopril and metoprolol, but did not take this morning. She is seeing cardiology next week.   Review of Systems  Gastrointestinal:  Positive for constipation and nausea. Negative for abdominal pain.      Objective:     BP (!) 132/94 (BP Location: Left Arm)   Pulse 80   Resp 14   Ht 5\' 3"  (1.6 m)   Wt 161 lb 6.4 oz (73.2 kg)   SpO2 100%   BMI 28.59 kg/m  BP Readings from Last 3 Encounters:  12/10/23 (!) 132/94  10/28/23 135/76  08/30/23 117/84   Wt Readings from Last 3 Encounters:  12/10/23 161 lb 6.4 oz (73.2 kg)  10/28/23 168 lb 2 oz (76.3 kg)  08/30/23 180 lb 1.9 oz (81.7 kg)      Physical Exam HENT:     Head: Normocephalic.  Cardiovascular:     Rate and Rhythm: Normal rate. Rhythm irregular.     Pulses: Normal pulses.     Heart sounds: Normal heart sounds.  Pulmonary:     Effort: Pulmonary effort is normal.     Breath sounds: Normal breath sounds.  Abdominal:     General: There is no distension.     Palpations: Abdomen is soft. There is no mass.      Tenderness: There is no abdominal tenderness. There is no right CVA tenderness, left CVA tenderness, guarding or rebound.     Hernia: No hernia is present.  Musculoskeletal:     Right lower leg: No edema.     Left lower leg: No edema.  Neurological:     General: No focal deficit present.     Mental Status: She is alert.  Psychiatric:        Mood and Affect: Mood normal.       The 10-year ASCVD risk score (Arnett DK, et al., 2019) is: 14.2%    Assessment & Plan:  Marland KitchenMarland KitchenSandrine was seen today for hypertension and weight loss.  Diagnoses and all orders for this visit:  Drug-induced constipation  Nausea and vomiting, unspecified vomiting type  Overweight  PAF (paroxysmal atrial fibrillation) (HCC)  Medication management  Primary hypertension   D/c Mounjaro. Advised patient to increase exercise and watch caloric intake to keep weight off. If she becomes constipated again, advised her to use Miralax with 4 oz of water or juice in mornings; provided patient with sample packets of Miralax. Advised patient to  avoid OTC weight loss products that include stimulants and caffeine due to A fib.  BP elevated today on Lisinopril and metoprolol but did not take medication this morning. Patient sees cardiology next week.  Return if symptoms worsen or fail to improve.    Kendra Gaw, PA-C

## 2023-12-10 NOTE — Patient Instructions (Addendum)
Follow up with cardiology for BP.  Start miralax daily until stooling regularly   Constipation, Adult Constipation is when a person has fewer than three bowel movements in a week, has difficulty having a bowel movement, or has stools (feces) that are dry, hard, or larger than normal. Constipation may be caused by an underlying condition. It may become worse with age if a person takes certain medicines and does not take in enough fluids. Follow these instructions at home: Eating and drinking  Eat foods that have a lot of fiber, such as beans, whole grains, and fresh fruits and vegetables. Limit foods that are low in fiber and high in fat and processed sugars, such as fried or sweet foods. These include french fries, hamburgers, cookies, candies, and soda. Drink enough fluid to keep your urine pale yellow. General instructions Exercise regularly or as told by your health care provider. Try to do 150 minutes of moderate exercise each week. Use the bathroom when you have the urge to go. Do not hold it in. Take over-the-counter and prescription medicines only as told by your health care provider. This includes any fiber supplements. During bowel movements: Practice deep breathing while relaxing the lower abdomen. Practice pelvic floor relaxation. Watch your condition for any changes. Let your health care provider know about them. Keep all follow-up visits as told by your health care provider. This is important. Contact a health care provider if: You have pain that gets worse. You have a fever. You do not have a bowel movement after 4 days. You vomit. You are not hungry or you lose weight. You are bleeding from the opening between the buttocks (anus). You have thin, pencil-like stools. Get help right away if: You have a fever and your symptoms suddenly get worse. You leak stool or have blood in your stool. Your abdomen is bloated. You have severe pain in your abdomen. You feel dizzy or  you faint. Summary Constipation is when a person has fewer than three bowel movements in a week, has difficulty having a bowel movement, or has stools (feces) that are dry, hard, or larger than normal. Eat foods that have a lot of fiber, such as beans, whole grains, and fresh fruits and vegetables. Drink enough fluid to keep your urine pale yellow. Take over-the-counter and prescription medicines only as told by your health care provider. This includes any fiber supplements. This information is not intended to replace advice given to you by your health care provider. Make sure you discuss any questions you have with your health care provider. Document Revised: 10/31/2022 Document Reviewed: 10/31/2022 Elsevier Patient Education  2024 [employer].

## 2024-01-22 ENCOUNTER — Ambulatory Visit: Payer: 59

## 2024-01-22 DIAGNOSIS — Z1231 Encounter for screening mammogram for malignant neoplasm of breast: Secondary | ICD-10-CM

## 2024-01-23 ENCOUNTER — Encounter: Payer: Self-pay | Admitting: Physician Assistant

## 2024-01-23 NOTE — Progress Notes (Signed)
Normal mammogram. Follow up in one year.

## 2024-01-27 ENCOUNTER — Ambulatory Visit: Payer: 59 | Admitting: Physician Assistant

## 2024-01-27 VITALS — BP 135/89 | HR 74 | Ht 63.0 in | Wt 165.0 lb

## 2024-01-27 DIAGNOSIS — E663 Overweight: Secondary | ICD-10-CM

## 2024-01-27 DIAGNOSIS — I480 Paroxysmal atrial fibrillation: Secondary | ICD-10-CM

## 2024-01-27 DIAGNOSIS — G4700 Insomnia, unspecified: Secondary | ICD-10-CM

## 2024-01-27 DIAGNOSIS — I10 Essential (primary) hypertension: Secondary | ICD-10-CM | POA: Diagnosis not present

## 2024-01-27 NOTE — Progress Notes (Signed)
Established Patient Office Visit  Subjective   Patient ID: Kendra Proctor, female    DOB: [birth year]  Age: 73 y.o. MRN: [id number]  Chief Complaint  Patient presents with   Hypertension    Elevated BP - patient states BP this morning at home was 152/93- ( reading in office wsa 135/89)  patient states Greggory Keen was stopped due to the elevated BP reading at last visit. States this is causing her anxiety as she is worried that she will gain back all of the weight that she had lost.     HPI Pt is a 73 yo female who presents to the clinic to discuss blood pressure. She was seen by cardiology and lisinopril/hydrochlorothiazide decreased to 10/12.5mg . she has been noticing her BP at home running high in the 130s-150s to 80-90s. She is concerned about this readings as well as her weight. She denies any CP, palpitations, headaches or vision changes.  She had to stop mounjaro due to GI side effects and she does not want to gain all her weight back.   .. Active Ambulatory Problems    Diagnosis Date Noted   Gout of foot 05/27/2020   Acquired hallux rigidus of left foot 05/22/2019   PAF (paroxysmal atrial fibrillation) (HCC) 03/08/2022   Hypertension 08/30/2023   Hyperlipidemia 08/30/2023   Osteopenia of left hip 11/29/2017   Seborrheic dermatitis of scalp 11/02/2016   Tubular adenoma of colon 11/30/2022   Class 1 obesity due to excess calories without serious comorbidity with body mass index (BMI) of 31.0 to 31.9 in adult 08/30/2023   History of gout 08/30/2023   Drug-induced constipation 12/10/2023   Nausea and vomiting 12/10/2023   Overweight 12/10/2023   Insomnia 01/27/2024   Resolved Ambulatory Problems    Diagnosis Date Noted   No Resolved Ambulatory Problems   Past Medical History:  Diagnosis Date   Colon polyp    Coronary artery disease    High cholesterol      ROS See HPI.    Objective:     BP 135/89   Pulse 74   Ht 5\' 3"  (1.6 m)   Wt 165 lb (74.8 kg)   SpO2 100%    BMI 29.23 kg/m  BP Readings from Last 3 Encounters:  01/27/24 135/89  12/10/23 (!) 132/94  10/28/23 135/76   Wt Readings from Last 3 Encounters:  01/27/24 165 lb (74.8 kg)  12/10/23 161 lb 6.4 oz (73.2 kg)  10/28/23 168 lb 2 oz (76.3 kg)      Physical Exam Constitutional:      Appearance: Normal appearance.  HENT:     Head: Normocephalic.  Cardiovascular:     Rate and Rhythm: Normal rate and regular rhythm.  Pulmonary:     Effort: Pulmonary effort is normal.     Breath sounds: Normal breath sounds.  Musculoskeletal:     Right lower leg: No edema.     Left lower leg: No edema.  Neurological:     General: No focal deficit present.     Mental Status: She is alert and oriented to person, place, and time.  Psychiatric:        Mood and Affect: Mood normal.        Assessment & Plan:  Marland KitchenMarland KitchenCelia was seen today for hypertension.  Diagnoses and all orders for this visit:  Primary hypertension  Overweight  PAF (paroxysmal atrial fibrillation) (HCC)  Insomnia, unspecified type   BP not to goal in office or at home Increase lisinopril  to 10/12.5mg  to 2 tablets Continue metoprolol bid Recheck BP in 2-4 weeks in office  .Marland KitchenDiscussed low carb diet with 1500 calories and 80g of protein.  Exercising at least 150 minutes a week.  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  Discussed trying slenderix drops online  Discussed good sleep routine Trial of magnesium glycinate 250mg  at bedtime  Follow up in 3 months    Tandy Gaw, PA-C

## 2024-01-27 NOTE — Patient Instructions (Signed)
Increase lisinopril 10/12.5mg  to 2 tablets daily.  Slenderix drops online Magnesium glycinate 250-500mg  at bedtime

## 2024-02-23 ENCOUNTER — Ambulatory Visit
Admission: EM | Admit: 2024-02-23 | Discharge: 2024-02-23 | Disposition: A | Discharge status: Home / Self Care | Payer: 59 | Attending: Family Medicine | Admitting: Family Medicine

## 2024-02-23 DIAGNOSIS — R509 Fever, unspecified: Secondary | ICD-10-CM | POA: Diagnosis not present

## 2024-02-23 DIAGNOSIS — R059 Cough, unspecified: Secondary | ICD-10-CM | POA: Diagnosis not present

## 2024-02-23 LAB — POCT INFLUENZA A/B
Influenza A, POC: NEGATIVE
Influenza B, POC: NEGATIVE

## 2024-02-23 LAB — POC SARS CORONAVIRUS 2 AG -  ED: SARS Coronavirus 2 Ag: NEGATIVE

## 2024-02-23 MED ORDER — BENZONATATE 200 MG PO CAPS: 200.0000 mg | ORAL_CAPSULE | Freq: Three times a day (TID) | ORAL | 0 refills | Status: AC | PRN

## 2024-02-23 MED ORDER — PROMETHAZINE-DM 6.25-15 MG/5ML PO SYRP: 5.0000 mL | ORAL_SOLUTION | Freq: Two times a day (BID) | ORAL | 0 refills | Status: DC | PRN

## 2024-02-23 MED ORDER — AZITHROMYCIN 250 MG PO TABS: 250.0000 mg | ORAL_TABLET | Freq: Every day | ORAL | 0 refills | Status: DC

## 2024-02-23 NOTE — ED Provider Notes (Signed)
 [facility]    CSN: [id number] Arrival date & time: 02/23/24  0846      History   Chief Complaint Chief Complaint  Patient presents with   Cough   Sore Throat    HPI Kendra Proctor is a 73 y.o. female.   HPI pleasant 73 year old female presents with cough, sore throat and fever for 4 days.  Reports temperature of 102.0 last night.  Reports recent travel to [place].  PMH significant for CAD, HLD, and HTN.  Past Medical History:  Diagnosis Date   Colon polyp    Coronary artery disease    High cholesterol    Hypertension     Patient Active Problem List   Diagnosis Date Noted   Insomnia 01/27/2024   Drug-induced constipation 12/10/2023   Nausea and vomiting 12/10/2023   Overweight 12/10/2023   Hypertension 08/30/2023   Hyperlipidemia 08/30/2023   Class 1 obesity due to excess calories without serious comorbidity with body mass index (BMI) of 31.0 to 31.9 in adult 08/30/2023   History of gout 08/30/2023   Tubular adenoma of colon 11/30/2022   PAF (paroxysmal atrial fibrillation) (HCC) 03/08/2022   Gout of foot 05/27/2020   Acquired hallux rigidus of left foot 05/22/2019   Osteopenia of left hip 11/29/2017   Seborrheic dermatitis of scalp 11/02/2016    Past Surgical History:  Procedure Laterality Date   BREAST EXCISIONAL BIOPSY     KNEE SURGERY     TONSILLECTOMY      OB History   No obstetric history on file.      Home Medications    Prior to Admission medications   Medication Sig Start Date End Date Taking? Authorizing Provider  azithromycin (ZITHROMAX) 250 MG tablet Take 1 tablet (250 mg total) by mouth daily. Take first 2 tablets together, then 1 every day until finished. 02/23/24  Yes Trevor Iha, FNP  benzonatate (TESSALON) 200 MG capsule Take 1 capsule (200 mg total) by mouth 3 (three) times daily as needed for up to 7 days. 02/23/24 03/01/24 Yes Trevor Iha, FNP  promethazine-dextromethorphan (PROMETHAZINE-DM) 6.25-15 MG/5ML syrup  Take 5 mLs by mouth 2 (two) times daily as needed for cough. 02/23/24  Yes Trevor Iha, FNP  allopurinol (ZYLOPRIM) 300 MG tablet Take 1 tablet (300 mg total) by mouth 2 (two) times daily. 10/15/23   Breeback, Lonna Cobb, PA-C  Ascorbic Acid (VITAMIN C PO) Take by mouth.    [provider]  aspirin EC 81 MG tablet Take 81 mg by mouth daily.    [provider]  atorvastatin (LIPITOR) 20 MG tablet Take 20 mg by mouth daily.    [provider]  colchicine 0.6 MG tablet Take 1 tablet (0.6 mg total) by mouth 2 (two) times daily. 08/30/23   Breeback, Jade L, PA-C  Cranberry Juice Extract 1000 MG CAPS Take by mouth.    [provider]  lisinopril-hydrochlorothiazide (ZESTORETIC) 10-12.5 MG tablet Take 1 tablet by mouth daily. 01/25/24   [provider]  metoprolol tartrate (LOPRESSOR) 100 MG tablet Take 100 mg by mouth 2 (two) times daily.    [provider]  ondansetron (ZOFRAN-ODT) 8 MG disintegrating tablet Take 1 tablet (8 mg total) by mouth every 8 (eight) hours as needed. 10/14/23   Breeback, Jade L, PA-C  VITAMIN D, CHOLECALCIFEROL, PO Take by mouth.    [provider]    Family History Family History  Problem Relation Age of Onset   Cancer Mother    Stroke  Father     Social History Social History   Tobacco Use   Smoking status: Former    Current packs/day: 0.00    Types: Cigarettes    Quit date: 06/17/2014    Years since quitting: 9.6   Smokeless tobacco: Never  Vaping Use   Vaping status: Some Days   Substances: Nicotine, Flavoring  Substance Use Topics   Alcohol use: Yes    Comment: Occasional   Drug use: No     Allergies   Prednisone, Propoxyphene, and Wellbutrin [bupropion]   Review of Systems Review of Systems  HENT:  Positive for sore throat.   Respiratory:  Positive for cough.   All other systems reviewed and are negative.    Physical Exam Triage Vital Signs ED Triage Vitals  Encounter Vitals  Group     BP 02/23/24 0900 132/82     Systolic BP Percentile --      Diastolic BP Percentile --      Pulse Rate 02/23/24 0900 (!) 106     Resp 02/23/24 0900 17     Temp 02/23/24 0900 98.2 F (36.8 C)     Temp Source 02/23/24 0900 Oral     SpO2 02/23/24 0900 97 %     Weight --      Height --      Head Circumference --      Peak Flow --      Pain Score 02/23/24 0901 0     Pain Loc --      Pain Education --      Exclude from Growth Chart --    No data found.  Updated Vital Signs BP 132/82 (BP Location: Right Arm)   Pulse (!) 106   Temp 98.2 F (36.8 C) (Oral)   Resp 17   SpO2 97%   Physical Exam Vitals and nursing note reviewed.  Constitutional:      Appearance: Normal appearance. She is normal weight. She is ill-appearing.  HENT:     Head: Normocephalic and atraumatic.     Right Ear: Tympanic membrane, ear canal and external ear normal.     Left Ear: Tympanic membrane, ear canal and external ear normal.     Nose: Nose normal.     Mouth/Throat:     Mouth: Mucous membranes are moist.     Pharynx: Oropharynx is clear.  Eyes:     Extraocular Movements: Extraocular movements intact.     Conjunctiva/sclera: Conjunctivae normal.     Pupils: Pupils are equal, round, and reactive to light.  Cardiovascular:     Rate and Rhythm: Normal rate and regular rhythm.     Pulses: Normal pulses.     Heart sounds: Normal heart sounds.  Pulmonary:     Effort: Pulmonary effort is normal.     Breath sounds: Normal breath sounds. No wheezing, rhonchi or rales.     Comments: Frequent cough on exam Musculoskeletal:        General: Normal range of motion.     Cervical back: Normal range of motion and neck supple.  Skin:    General: Skin is warm and dry.  Neurological:     General: No focal deficit present.     Mental Status: She is alert and oriented to person, place, and time. Mental status is at baseline.  Psychiatric:        Mood and Affect: Mood normal.      UC Treatments /  Results  Labs (all labs ordered are  listed, but only abnormal results are displayed) Labs Reviewed  POCT INFLUENZA A/B  POC SARS CORONAVIRUS 2 AG -  ED    EKG   Radiology No results found.  Procedures Procedures (including critical care time)  Medications Ordered in UC Medications - No data to display  Initial Impression / Assessment and Plan / UC Course  I have reviewed the triage vital signs and the nursing notes.  Pertinent labs & imaging results that were available during my care of the patient were reviewed by me and considered in my medical decision making (see chart for details).     MDM: 1.  Cough, unspecified type-Rx'd Zithromax (500 mg day 1, 250 mg day 2-5), Rx'd Tessalon 200 mg capsule: Take 1 capsule 3 times daily, as needed for cough, Rx'd Promethazine DM 6.25-15 Mg/5 mL syrup: Take 5 mL twice daily, as needed for cough; 2.  Fever, unspecified-Advised patient may take OTC Tylenol 1 g every 6 hours for fever (oral temperature greater than 100.3). Advised patient to take medications as directed with food to completion.  Advised patient may take Tessalon capsules daily or as needed for cough.  Advised may take Promethazine DM at night for cough prior to sleep due to sedative effects.  Advised patient may take OTC Tylenol 1 g every 6 hours for fever (oral temperature greater than 100.3).  Encouraged to increase daily water intake to 64 ounces per day while taking these medications.  Advised if symptoms worsen and/or unresolved please follow-up with your PCP or here for further evaluation. Final Clinical Impressions(s) / UC Diagnoses   Final diagnoses:  Fever, unspecified  Cough, unspecified type     Discharge Instructions      Advised patient to take medications as directed with food to completion.  Advised patient may take Tessalon capsules daily or as needed for cough.  Advised may take Promethazine DM at night for cough prior to sleep due to sedative effects.   Advised patient may take OTC Tylenol 1 g every 6 hours for fever (oral temperature greater than 100.3).  Encouraged to increase daily water intake to 64 ounces per day while taking these medications.  Advised if symptoms worsen and/or unresolved please follow-up with your PCP or here for further evaluation.     ED Prescriptions     Medication Sig Dispense Auth. Provider   azithromycin (ZITHROMAX) 250 MG tablet Take 1 tablet (250 mg total) by mouth daily. Take first 2 tablets together, then 1 every day until finished. 6 tablet Trevor Iha, FNP   benzonatate (TESSALON) 200 MG capsule Take 1 capsule (200 mg total) by mouth 3 (three) times daily as needed for up to 7 days. 40 capsule Trevor Iha, FNP   promethazine-dextromethorphan (PROMETHAZINE-DM) 6.25-15 MG/5ML syrup Take 5 mLs by mouth 2 (two) times daily as needed for cough. 118 mL Trevor Iha, FNP      PDMP not reviewed this encounter.   Trevor Iha, FNP 02/23/24 [phone]

## 2024-02-23 NOTE — Discharge Instructions (Addendum)
 Advised patient to take medications as directed with food to completion.  Advised patient may take Tessalon capsules daily or as needed for cough.  Advised may take Promethazine DM at night for cough prior to sleep due to sedative effects.  Advised patient may take OTC Tylenol 1 g every 6 hours for fever (oral temperature greater than 100.3).  Encouraged to increase daily water intake to 64 ounces per day while taking these medications.  Advised if symptoms worsen and/or unresolved please follow-up with your PCP or here for further evaluation.

## 2024-02-23 NOTE — ED Triage Notes (Addendum)
 Pt c/o cough, sore throat and fever since Thurs. Fever 102 last night. Recent travel to [place]. Acetaminophen and OTC cold and flu prn.

## 2024-02-28 ENCOUNTER — Encounter: Payer: Self-pay | Admitting: Physician Assistant

## 2024-02-28 MED ORDER — HYDROCODONE BIT-HOMATROP MBR 5-1.5 MG/5ML PO SOLN: 5.0000 mL | Freq: Three times a day (TID) | ORAL | 0 refills | Status: DC | PRN

## 2024-03-24 ENCOUNTER — Other Ambulatory Visit: Payer: Self-pay | Admitting: Physician Assistant

## 2024-03-24 DIAGNOSIS — M1A0790 Idiopathic chronic gout, unspecified ankle and foot, without tophus (tophi): Secondary | ICD-10-CM

## 2024-03-24 DIAGNOSIS — M79671 Pain in right foot: Secondary | ICD-10-CM

## 2024-04-16 ENCOUNTER — Other Ambulatory Visit: Payer: Self-pay | Admitting: Physician Assistant

## 2024-04-16 DIAGNOSIS — E782 Mixed hyperlipidemia: Secondary | ICD-10-CM

## 2024-04-16 NOTE — Telephone Encounter (Signed)
 Copied from CRM [id number]. Topic: Clinical - Medication Refill >> Apr 16, 2024  9:36 AM Blair Bumpers wrote: Most Recent Primary Care Visit:  Provider: Araceli Knight  Department: [facility] CARE [facility]  Visit Type: OFFICE VISIT  Date: 01/27/2024  Medication: atorvastatin (LIPITOR) 20 MG tablet  Has the patient contacted their pharmacy? No (Agent: If no, request that the patient contact the pharmacy for the refill. If patient does not wish to contact the pharmacy document the reason why and proceed with request.) (Agent: If yes, when and what did the pharmacy advise?)  Is this the correct pharmacy for this prescription? Yes If no, delete pharmacy and type the correct one.  This is the patient's preferred pharmacy:  [facility] [address], [place] - [address] [place] [address] Phone: [phone] Fax: [phone]   Has the prescription been filled recently? Yes  Is the patient out of the medication? No  Has the patient been seen for an appointment in the last year OR does the patient have an upcoming appointment? Yes  Can we respond through MyChart? Yes  Agent: Please be advised that Rx refills may take up to 3 business days. We ask that you follow-up with your pharmacy.

## 2024-04-21 MED ORDER — ATORVASTATIN CALCIUM 20 MG PO TABS: 20.0000 mg | ORAL_TABLET | Freq: Every day | ORAL | 1 refills | Status: DC

## 2024-04-27 ENCOUNTER — Encounter: Payer: Self-pay | Admitting: Physician Assistant

## 2024-04-27 ENCOUNTER — Ambulatory Visit: Payer: 59 | Admitting: Physician Assistant

## 2024-04-27 VITALS — BP 121/83 | HR 66 | Ht 63.0 in | Wt 166.0 lb

## 2024-04-27 DIAGNOSIS — Z0000 Encounter for general adult medical examination without abnormal findings: Secondary | ICD-10-CM | POA: Diagnosis not present

## 2024-04-27 DIAGNOSIS — I480 Paroxysmal atrial fibrillation: Secondary | ICD-10-CM | POA: Diagnosis not present

## 2024-04-27 DIAGNOSIS — I10 Essential (primary) hypertension: Secondary | ICD-10-CM

## 2024-04-27 DIAGNOSIS — E782 Mixed hyperlipidemia: Secondary | ICD-10-CM | POA: Diagnosis not present

## 2024-04-27 DIAGNOSIS — Z87891 Personal history of nicotine dependence: Secondary | ICD-10-CM

## 2024-04-27 DIAGNOSIS — M1A0790 Idiopathic chronic gout, unspecified ankle and foot, without tophus (tophi): Secondary | ICD-10-CM

## 2024-04-27 DIAGNOSIS — Z1159 Encounter for screening for other viral diseases: Secondary | ICD-10-CM

## 2024-04-27 MED ORDER — LISINOPRIL-HYDROCHLOROTHIAZIDE 20-25 MG PO TABS: 1.0000 | ORAL_TABLET | Freq: Every day | ORAL | 3 refills | Status: DC

## 2024-04-27 NOTE — Progress Notes (Unsigned)
 Established Patient Office Visit  Subjective   Patient ID: Kendra Proctor, female    DOB: June 23, 1951  Age: 73 y.o. MRN: [id number]  Chief Complaint  Patient presents with   Medical Management of Chronic Issues    3 mo fup on Primary hypertension    HPI Pt is a 73 yo female with HTN, PAF, HLD, Gout who presents to the clinic for follow up.   She is doing well. She did increase her BP medication to 2 tablets, what she was previously on, due to elevated BP readings. Now BP readings at home in the 120s over 70s. She denies any palpitations, headaches, CP. She is doing well.   She sees cardiology every 6 months for PAF.   She does feel more tired lately and wants to make sure all her labs are good. She denies any SOB. She is a former smoker.     Review of Systems  All other systems reviewed and are negative.     Objective:     BP 121/83   Pulse 66   Ht 5\' 3"  (1.6 m)   Wt 166 lb (75.3 kg)   SpO2 99%   BMI 29.41 kg/m  BP Readings from Last 3 Encounters:  04/27/24 121/83  02/23/24 132/82  01/27/24 135/89   Wt Readings from Last 3 Encounters:  04/27/24 166 lb (75.3 kg)  01/27/24 165 lb (74.8 kg)  12/10/23 161 lb 6.4 oz (73.2 kg)      Physical Exam Constitutional:      Appearance: Normal appearance.  HENT:     Head: Normocephalic.  Cardiovascular:     Rate and Rhythm: Normal rate and regular rhythm.  Pulmonary:     Effort: Pulmonary effort is normal.     Breath sounds: Normal breath sounds.  Musculoskeletal:     Cervical back: Normal range of motion and neck supple. No tenderness.     Right lower leg: No edema.     Left lower leg: No edema.  Lymphadenopathy:     Cervical: No cervical adenopathy.  Neurological:     General: No focal deficit present.     Mental Status: She is alert and oriented to person, place, and time.  Psychiatric:        Mood and Affect: Mood normal.        The 10-year ASCVD risk score (Arnett DK, et al., 2019) is: 13.4%     Assessment & Plan:  .Aaron AasAaron AasLillieann was seen today for medical management of chronic issues.  Diagnoses and all orders for this visit:  Preventative health care -     Lipid panel -     CMP14+EGFR -     TSH -     CBC w/Diff/Platelet -     VITAMIN D 25 Hydroxy (Vit-D Deficiency, Fractures) -     Uric acid -     Hepatitis C Antibody -     B12 and Folate Panel  Primary hypertension -     CMP14+EGFR -     lisinopril-hydrochlorothiazide (ZESTORETIC) 20-25 MG tablet; Take 1 tablet by mouth daily.  PAF (paroxysmal atrial fibrillation) (HCC) -     CMP14+EGFR  Mixed hyperlipidemia -     Lipid panel  Idiopathic chronic gout of foot without tophus, unspecified laterality -     Uric acid  Encounter for hepatitis C screening test for low risk patient -     Hepatitis C Antibody   Encouraged patient to get shingles vaccine at pharmacy Colonoscopy/mammogram/bone  density UTD Former smoker- referral to be screened for lung cancer screening with CT  Vitals look great Fasting labs ordered today Refilled zestoretic at dose patient is taking    Return in about 6 months (around 10/27/2024).    Laurinda Carreno, PA-C

## 2024-04-27 NOTE — Patient Instructions (Addendum)
 Get labs Consider lung cancer screening

## 2024-04-28 ENCOUNTER — Encounter: Payer: Self-pay | Admitting: Physician Assistant

## 2024-04-28 LAB — CBC WITH DIFFERENTIAL/PLATELET
Basophils Absolute: 0.1 10*3/uL (ref 0.0–0.2)
Basos: 1 %
EOS (ABSOLUTE): 0.3 10*3/uL (ref 0.0–0.4)
Eos: 4 %
Hematocrit: 39.7 % (ref 34.0–46.6)
Hemoglobin: 13.9 g/dL (ref 11.1–15.9)
Immature Grans (Abs): 0 10*3/uL (ref 0.0–0.1)
Immature Granulocytes: 0 %
Lymphocytes Absolute: 1.6 10*3/uL (ref 0.7–3.1)
Lymphs: 25 %
MCH: 31.1 pg (ref 26.6–33.0)
MCHC: 35 g/dL (ref 31.5–35.7)
MCV: 89 fL (ref 79–97)
Monocytes Absolute: 0.6 10*3/uL (ref 0.1–0.9)
Monocytes: 10 %
Neutrophils Absolute: 3.9 10*3/uL (ref 1.4–7.0)
Neutrophils: 60 %
Platelets: 250 10*3/uL (ref 150–450)
RBC: 4.47 x10E6/uL (ref 3.77–5.28)
RDW: 13.5 % (ref 11.7–15.4)
WBC: 6.5 10*3/uL (ref 3.4–10.8)

## 2024-04-28 LAB — LIPID PANEL
Chol/HDL Ratio: 3.3 ratio (ref 0.0–4.4)
Cholesterol, Total: 158 mg/dL (ref 100–199)
HDL: 48 mg/dL (ref >39)
LDL Chol Calc (NIH): 87 mg/dL (ref 0–99)
Triglycerides: 127 mg/dL (ref 0–149)
VLDL Cholesterol Cal: 23 mg/dL (ref 5–40)

## 2024-04-28 LAB — B12 AND FOLATE PANEL
Folate: 11.7 ng/mL (ref >3.0)
Vitamin B-12: 819 pg/mL (ref 232–1245)

## 2024-04-28 LAB — CMP14+EGFR
ALT: 8 IU/L (ref 0–32)
AST: 22 IU/L (ref 0–40)
Albumin: 4.3 g/dL (ref 3.8–4.8)
Alkaline Phosphatase: 98 IU/L (ref 44–121)
BUN/Creatinine Ratio: 27 (ref 12–28)
BUN: 21 mg/dL (ref 8–27)
Bilirubin Total: 0.8 mg/dL (ref 0.0–1.2)
CO2: 26 mmol/L (ref 20–29)
Calcium: 10.1 mg/dL (ref 8.7–10.3)
Chloride: 97 mmol/L (ref 96–106)
Creatinine, Ser: 0.77 mg/dL (ref 0.57–1.00)
Globulin, Total: 3 g/dL (ref 1.5–4.5)
Glucose: 82 mg/dL (ref 70–99)
Potassium: 3.9 mmol/L (ref 3.5–5.2)
Sodium: 138 mmol/L (ref 134–144)
Total Protein: 7.3 g/dL (ref 6.0–8.5)
eGFR: 82 mL/min/{1.73_m2} (ref >59)

## 2024-04-28 LAB — TSH: TSH: 2.39 u[IU]/mL (ref 0.450–4.500)

## 2024-04-28 LAB — VITAMIN D 25 HYDROXY (VIT D DEFICIENCY, FRACTURES): Vit D, 25-Hydroxy: 43.1 ng/mL (ref 30.0–100.0)

## 2024-04-28 LAB — URIC ACID: Uric Acid: 5.3 mg/dL (ref 3.1–7.9)

## 2024-04-28 LAB — HEPATITIS C ANTIBODY: Hep C Virus Ab: NONREACTIVE

## 2024-04-28 NOTE — Progress Notes (Signed)
 Hadasa,   Cholesterol looks GREAT.  Kidney, liver, glucose look wonderful.  Thyroid normal.  Vitamin d looks good.  Uric acid to goal.  Hemoglobin looks great.  B12 wonderful!  Labs look GREAT.

## 2024-04-30 ENCOUNTER — Other Ambulatory Visit: Payer: Self-pay | Admitting: Physician Assistant

## 2024-05-06 MED ORDER — LOSARTAN POTASSIUM-HCTZ 50-12.5 MG PO TABS
1.0000 | ORAL_TABLET | Freq: Every day | ORAL | 1 refills | Status: DC
Start: 2024-05-06 — End: 2024-07-01

## 2024-05-06 NOTE — Addendum Note (Signed)
 Addended by: Araceli Knight on: 05/06/2024 12:29 PM   Modules accepted: Orders

## 2024-05-12 ENCOUNTER — Telehealth: Payer: Self-pay | Admitting: Acute Care

## 2024-05-12 DIAGNOSIS — Z87891 Personal history of nicotine dependence: Secondary | ICD-10-CM

## 2024-05-12 DIAGNOSIS — Z122 Encounter for screening for malignant neoplasm of respiratory organs: Secondary | ICD-10-CM

## 2024-05-12 NOTE — Telephone Encounter (Signed)
 Lung Cancer Screening Narrative/Criteria Questionnaire (Cigarette Smokers Only- No Cigars/Pipes/vapes)   Kendra Proctor   SDMV:06/09/2024 3:30  [name], NP   August 01, 1951   LDCT: 06/10/2024 8:30 MKV    72 y.o.   Phone: [phone]  Lung Screening Narrative (confirm age 48-77 yrs Medicare / 50-80 yrs Private pay insurance)   Insurance information:UHC   Referring Provider:[name], [place]   This screening involves an initial phone call with a team member from our program. It is called a shared decision making visit. The initial meeting is required by  insurance and Medicare to make sure you understand the program. This appointment takes about 15-20 minutes to complete. You will complete the screening scan at your scheduled date/time.  This scan takes about 5-10 minutes to complete. You can eat and drink normally before and after the scan.  Criteria questions for Lung Cancer Screening:   Are you a current or former smoker? Former Age began smoking: 73yo   If you are a former smoker, what year did you quit smoking? 2010 (within 15 yrs)   To calculate your smoking history, I need an accurate estimate of how many packs of cigarettes you smoked per day and for how many years. (Not just the number of PPD you are now smoking)   Years smoking 56 x Packs per day 3/4 = Pack years 42   (at least 20 pack yrs)   (Make sure they understand that we need to know how much they have smoked in the past, not just the number of PPD they are smoking now)  Do you have a personal history of cancer?  No    Do you have a family history of cancer? Yes  (cancer type and and relative) Mother - colon, Sister- breast  Are you coughing up blood?  No  Have you had unexplained weight loss of 15 lbs or more in the last 6 months? No  It looks like you meet all criteria.  When would be a good time for us  to schedule you for this screening?   Additional information: N/A

## 2024-06-08 ENCOUNTER — Ambulatory Visit

## 2024-06-08 ENCOUNTER — Other Ambulatory Visit: Payer: Self-pay | Admitting: Physician Assistant

## 2024-06-08 MED ORDER — CYCLOBENZAPRINE HCL 10 MG PO TABS
10.0000 mg | ORAL_TABLET | Freq: Three times a day (TID) | ORAL | 0 refills | Status: DC | PRN
Start: 2024-06-08 — End: 2024-07-27

## 2024-06-08 NOTE — Progress Notes (Addendum)
 Pt here for BP check. Denies SOB,CP, headaches, or med issues.           Pt first BP reading 125/85. Pt sat for 10 mins BP was 124/86. Pt also mentioned some left neck radiating pain that she has had for about 3-4 wks. Reported off to [facility]. PA-C. Pt bp is good and Rodger Civil is going to send in muscle relaxer and pt advised to to make an in person visit.

## 2024-06-09 ENCOUNTER — Encounter: Payer: Self-pay | Admitting: Acute Care

## 2024-06-09 ENCOUNTER — Ambulatory Visit: Admitting: Acute Care

## 2024-06-09 DIAGNOSIS — Z87891 Personal history of nicotine dependence: Secondary | ICD-10-CM

## 2024-06-09 NOTE — Patient Instructions (Signed)

## 2024-06-09 NOTE — Progress Notes (Signed)
 Virtual Visit via Telephone Note  I connected with Kendra Proctor on 06/09/24 at  3:30 PM EDT by telephone and verified that I am speaking with the correct person using two identifiers.  Location: Patient:  At home Provider:  [address], [place], [place], [address]    I discussed the limitations, risks, security and privacy concerns of performing an evaluation and management service by telephone and the availability of in person appointments. I also discussed with the patient that there may be a patient responsible charge related to this service. The patient expressed understanding and agreed to proceed.    Shared Decision Making Visit Lung Cancer Screening Program [phone])   Eligibility: Age 73 y.o. Pack Years Smoking History Calculation 42 pack year smoking history (# packs/per year x # years smoked) Recent History of coughing up blood  no Unexplained weight loss? no ( >Than 15 pounds within the last 6 months ) Prior History Lung / other cancer no (Diagnosis within the last 5 years already requiring surveillance chest CT Scans). Smoking Status Former Smoker Former Smokers: Years since quit: 11 years  Quit Date: 2014  Visit Components: Discussion included one or more decision making aids. yes Discussion included risk/benefits of screening. yes Discussion included potential follow up diagnostic testing for abnormal scans. yes Discussion included meaning and risk of over diagnosis. yes Discussion included meaning and risk of False Positives. yes Discussion included meaning of total radiation exposure. yes  Counseling Included: Importance of adherence to annual lung cancer LDCT screening. yes Impact of comorbidities on ability to participate in the program. yes Ability and willingness to under diagnostic treatment. yes  Smoking Cessation Counseling: Current Smokers:  Discussed importance of smoking cessation. yes Information about tobacco cessation classes and  interventions provided to patient. yes Patient provided with "ticket" for LDCT Scan. yes Symptomatic Patient. no  Counseling NA Diagnosis Code: Tobacco Use Z72.0 Asymptomatic Patient yes  Counseling (Intermediate counseling: > three minutes counseling) V7846 Former Smokers:  Discussed the importance of maintaining cigarette abstinence. yes Diagnosis Code: Personal History of Nicotine Dependence. N62.952 Information about tobacco cessation classes and interventions provided to patient. Yes Patient provided with "ticket" for LDCT Scan. yes Written Order for Lung Cancer Screening with LDCT placed in Epic. Yes (CT Chest Lung Cancer Screening Low Dose W/O CM) [id number] Z12.2-Screening of respiratory organs Z87.891-Personal history of nicotine dependence  Former smoker    [name], NP 06/09/2024

## 2024-06-10 ENCOUNTER — Ambulatory Visit

## 2024-06-10 DIAGNOSIS — Z122 Encounter for screening for malignant neoplasm of respiratory organs: Secondary | ICD-10-CM | POA: Diagnosis not present

## 2024-06-10 DIAGNOSIS — Z87891 Personal history of nicotine dependence: Secondary | ICD-10-CM | POA: Diagnosis not present

## 2024-06-22 ENCOUNTER — Other Ambulatory Visit: Payer: Self-pay

## 2024-06-22 DIAGNOSIS — Z122 Encounter for screening for malignant neoplasm of respiratory organs: Secondary | ICD-10-CM

## 2024-06-22 DIAGNOSIS — Z87891 Personal history of nicotine dependence: Secondary | ICD-10-CM

## 2024-06-30 ENCOUNTER — Other Ambulatory Visit: Payer: Self-pay | Admitting: Physician Assistant

## 2024-07-16 ENCOUNTER — Encounter: Payer: Self-pay | Admitting: Physician Assistant

## 2024-07-27 ENCOUNTER — Ambulatory Visit: Admitting: Sports Medicine

## 2024-07-27 ENCOUNTER — Encounter: Payer: Self-pay | Admitting: Sports Medicine

## 2024-07-27 ENCOUNTER — Other Ambulatory Visit: Payer: Self-pay | Admitting: Physician Assistant

## 2024-07-27 ENCOUNTER — Ambulatory Visit

## 2024-07-27 DIAGNOSIS — M5412 Radiculopathy, cervical region: Secondary | ICD-10-CM

## 2024-07-27 DIAGNOSIS — M542 Cervicalgia: Secondary | ICD-10-CM

## 2024-07-27 MED ORDER — TIZANIDINE HCL 4 MG PO TABS: 4.0000 mg | ORAL_TABLET | Freq: Three times a day (TID) | ORAL | 3 refills | Status: AC | PRN

## 2024-07-27 MED ORDER — MELOXICAM 15 MG PO TABS: ORAL_TABLET | ORAL | 3 refills | Status: DC

## 2024-07-27 MED ORDER — TRIAZOLAM 0.25 MG PO TABS: ORAL_TABLET | ORAL | 0 refills | Status: DC

## 2024-07-27 NOTE — Assessment & Plan Note (Signed)
 Very pleasant 73 year old female, she has had several weeks of increasing discomfort left side of the neck, radiation over to the left deltoid but not past the elbow. She has noted some progressive weakness in both hands. No other red flag symptoms such as fevers, chills, urinary or stool incontinence. No trauma. She is intolerant to prednisone. We will switch her from ibuprofen to meloxicam , switch from Flexeril  to Zanaflex , due to the progressive weakness we will get x-rays and an MRI. Formal PT ordered, return to see me in 6 weeks.

## 2024-07-27 NOTE — Telephone Encounter (Signed)
 Dr. Karie Schwalbe,  Please see mychart message sent by pt and advise.

## 2024-07-27 NOTE — Progress Notes (Signed)
    Procedures performed today:    None.  Independent interpretation of notes and tests performed by another provider:   None.  Brief History, Exam, Impression, and Recommendations:    Radiculitis of left cervical region Very pleasant 73 year old female, she has had several weeks of increasing discomfort left side of the neck, radiation over to the left deltoid but not past the elbow. She has noted some progressive weakness in both hands. No other red flag symptoms such as fevers, chills, urinary or stool incontinence. No trauma. She is intolerant to prednisone. We will switch her from ibuprofen to meloxicam , switch from Flexeril  to Zanaflex , due to the progressive weakness we will get x-rays and an MRI. Formal PT ordered, return to see me in 6 weeks.    ____________________________________________ Debby PARAS. Curtis, M.D., ABFM., CAQSM., AME. Primary Care and Sports Medicine [facility]  Adjunct Professor of [facility] Medicine  [employer]  Restaurant manager, fast food

## 2024-08-01 ENCOUNTER — Ambulatory Visit: Payer: Self-pay | Admitting: Sports Medicine

## 2024-08-01 NOTE — Therapy (Unsigned)
 OUTPATIENT PHYSICAL THERAPY CERVICAL EVALUATION   Patient Name: Kendra Proctor MRN: [id number] DOB:1951/04/09, 73 y.o., female Today's Date: 08/03/2024  END OF SESSION:  PT End of Session - 08/03/24 1012     Visit Number 1    Number of Visits 16    Date for PT Re-Evaluation 09/28/24    Authorization Type UHC 100 visits/yr with OT copay $30    PT Start Time 0930    PT Stop Time 1015    PT Time Calculation (min) 45 min    Activity Tolerance Patient tolerated treatment well          Past Medical History:  Diagnosis Date   Colon polyp    Coronary artery disease    High cholesterol    Hypertension    Past Surgical History:  Procedure Laterality Date   BREAST EXCISIONAL BIOPSY     KNEE SURGERY     TONSILLECTOMY     Patient Active Problem List   Diagnosis Date Noted   Radiculitis of left cervical region 07/27/2024   Insomnia 01/27/2024   Drug-induced constipation 12/10/2023   Nausea and vomiting 12/10/2023   Overweight 12/10/2023   Hypertension 08/30/2023   Hyperlipidemia 08/30/2023   Class 1 obesity due to excess calories without serious comorbidity with body mass index (BMI) of 31.0 to 31.9 in adult 08/30/2023   History of gout 08/30/2023   Tubular adenoma of colon 11/30/2022   PAF (paroxysmal atrial fibrillation) (HCC) 03/08/2022   Gout of foot 05/27/2020   Acquired hallux rigidus of left foot 05/22/2019   Osteopenia of left hip 11/29/2017   Seborrheic dermatitis of scalp 11/02/2016    PCP: Vermell LITTIE Bologna, PA-C  REFERRING PROVIDER: Dr Debby JINNY Petties  REFERRING DIAG: Radiculitis L cervical region   THERAPY DIAG:  Cervical dysfunction  Other symptoms and signs involving the musculoskeletal system  Abnormal posture  Muscle weakness (generalized)  Rationale for Evaluation and Treatment: Rehabilitation  ONSET DATE: 05/31/24  SUBJECTIVE:                                                                                                                                                                                                          SUBJECTIVE STATEMENT: Patient reports that she started having pain in the neck in the past 2 months. She noticed sudden sharp pain in the L neck and into the shoulder. She treated symptoms with OTC meds with some improvement. She tried prescription meds and that created side effects. Now only taking muscle relaxants.  Hand dominance: Right  PERTINENT HISTORY:  HTN; a-fib; has a watchman mesh in her heart to prevent blood clots; chronic gout; osteopenia  PAIN:  Are you having pain? Yes: NPRS scale: 5-6/10; best 0/10; worst 5-6/10  Pain location: L neck to shoulder  Pain description: dull; throbbing; may have sharp pain but it doesn't last long Aggravating factors: prolonged sitting > 2 hours; worse during the day Relieving factors: meds  PRECAUTIONS: osteopenia cervical spine   RED FLAGS: None     WEIGHT BEARING RESTRICTIONS: No  FALLS:  Has patient fallen in last 6 months? 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  DIAGNOSTIC FINDINGS:  Xray 07/27/24 cervical: Osteopenia. The cervical spine is visualized from C1-C6. Cervical alignment is maintained. Vertebral body heights are maintained: no evidence of acute fracture. Moderate intervertebral disc space height loss of C5-6. Mild intervertebral disc space height loss at 4-5. Multilevel facet arthropathy and uncovertebral hypertrophy results in moderate RIGHT and mild-to-moderate LEFT osseous neuroforaminal  narrowing at C3-4 and C5-6. Mild osseous neuroforaminal narrowing at bilateral C4-5. No prevertebral soft tissue swelling. Visualized thorax is unremarkable.   IMPRESSION: Multilevel degenerative changes of the cervical spine most pronounced at C5-6.  PATIENT SURVEYS:  PSFS: THE PATIENT SPECIFIC FUNCTIONAL SCALE  Place score of 0-10 (0 = unable to perform activity and 10 = able to perform activity at the same level as before injury or problem)  Activity Date: 08/03/24    Sitting > 2 hours  8    2. Looking up  5    3. Looking up  5    4.      Total Score 18      Total Score = Sum of activity scores/number of activities 18/3= 6  Minimally Detectable Change: 3 points (for single activity); 2 points (for average score)  [name] (nd). The Patient Specific Functional Scale . Retrieved from [email or web address]   COGNITION: Overall cognitive status: Within functional limits for tasks assessed  SENSATION: Intermittent tingling in the arm above above elbow - lasting a few seconds to 1 min   POSTURE: rounded shoulders, forward head, increased thoracic kyphosis, and flexed trunk   PALPATION: Tight with PA and lateral mobs L through the thoracic spine Muscular tightness L > R pecs; upper trap; leveator    CERVICAL ROM:   Active ROM A/PROM (deg) eval  Flexion 52 pain  Extension 34 pain  Right lateral flexion 14 pull   Left lateral flexion 25  Right rotation 58  Left rotation 52 pull    (Blank rows = not tested)  UPPER EXTREMITY ROM: limited end ranges elevation; rotation  Active ROM Right eval Left eval  Shoulder flexion    Shoulder extension    Shoulder abduction    Shoulder adduction    Shoulder extension    Shoulder internal rotation    Shoulder external rotation    Elbow flexion    Elbow extension    Wrist flexion    Wrist extension    Wrist ulnar deviation    Wrist radial deviation    Wrist  pronation    Wrist supination     (  Blank rows = not tested)  UPPER EXTREMITY MMT:  MMT Right eval Left eval  Shoulder flexion    Shoulder extension    Shoulder abduction    Shoulder adduction    Shoulder extension    Shoulder internal rotation    Shoulder external rotation    Middle trapezius 4 4  Lower trapezius 4 4  Elbow flexion    Elbow extension    Wrist flexion    Wrist extension    Wrist ulnar deviation    Wrist radial deviation    Wrist pronation    Wrist supination    Grip strength     (Blank rows = not tested)  CERVICAL SPECIAL TESTS:  Spurling's test: Negative, and Distraction test: Negative  TREATMENT DATE:                                                                                                                           Eval; HEP   PATIENT EDUCATION:  Education details: POC; HEP Person educated: Patient Education method: Programmer, multimedia, Facilities manager, Actor cues, Verbal cues, and Handouts Education comprehension: verbalized understanding, returned demonstration, verbal cues required, tactile cues required, and needs further education  HOME EXERCISE PROGRAM: Access Code: [email or web address] URL: [email or web address] Date: 08/03/2024 Prepared by: Markeem Noreen  Exercises - Seated Cervical Retraction  - 2 x daily - 7 x weekly - 1-2 sets - 5-10 reps - 10 sec  hold - Seated Passive Cervical Retraction  - 2 x daily - 7 x weekly - 1 sets - 5-10 reps - 5-10 sec  hold - Seated Scapular Retraction  - 2 x daily - 7 x weekly - 1-2 sets - 10 reps - 10 sec  hold - Shoulder External Rotation and Scapular Retraction  - 1-2 x daily - 7 x weekly - 1 sets - 10 reps - 3-5 sec   hold - Doorway Pec Stretch at 60 Degrees Abduction  - 3 x daily - 7 x weekly - 1 sets - 3 reps - Doorway Pec Stretch at 90 Degrees Abduction  - 3 x daily - 7 x weekly - 1 sets - 3 reps - 30 seconds  hold - Doorway Pec Stretch at 120 Degrees Abduction  - 3 x daily - 7 x weekly - 1 sets - 3  reps - 30 second hold  hold - Standing Pectoral Release with Ball at Wall  - 3-4 x daily - 7 x weekly - Standing Infraspinatus/Teres Minor Release with Ball at Wall  - 2 x daily - 7 x weekly  Patient Education - Office Posture  ASSESSMENT:  CLINICAL IMPRESSION: Patient is a 73 y.o. female who was seen today for physical therapy evaluation and treatment for L cervical radiculitis which was sudden onset with no known injury. Patient presents with poor posture and alignment; limited cervical and thoracic mobility/ROM; end range pain and tightness with cervical motions; tightness end range shoulder elevation and rotation; muscular tightness in the ant/lat/posterior cervical musculature,  pec, upper traps. Patient has pain with sitting > 2 hours and intermittent radicular tingling L UE. Patient will benefit form PT to address problems identified.   OBJECTIVE IMPAIRMENTS: decreased activity tolerance, decreased mobility, decreased ROM, decreased strength, hypomobility, increased fascial restrictions, improper body mechanics, postural dysfunction, and pain.   ACTIVITY LIMITATIONS: sitting and reach over head  PARTICIPATION LIMITATIONS: occupation and yard work  PERSONAL FACTORS: Fitness, Past/current experiences, Profession, and Time since onset of injury/illness/exacerbation are also affecting patient's functional outcome.   REHAB POTENTIAL: Good  CLINICAL DECISION MAKING: Evolving/moderate complexity  EVALUATION COMPLEXITY: Moderate   GOALS: Goals reviewed with patient? Yes  SHORT TERM GOALS: Target date: 08/31/2024   Independent in initial HEP  Baseline:  Goal status: INITIAL  2.  Resolution of radicular tingling L UE  Baseline:  Goal status: INITIAL  3.  Increase cervical ROM by 2-5 degrees in limited planes of motion  Baseline:  Goal status: INITIAL   LONG TERM GOALS: Target date: 09/28/2024   Patient reports 80-90% improvement in cervical pain and discomfort  Baseline:   Goal status: INITIAL  2.  Improve posture and alignment with engagement of posterior shoulder girdle musculature and 5/5 strength in middle and lower traps Baseline:  Goal status: INITIAL  3.  Patient verbalizes and or demonstrates good posture & alignment for desk/computer work & reading to increase sitting time to > 2-3 hours Baseline:  Goal status: INITIAL  4.  Full, pain free cervical ROM  Baseline:  Goal status: INITIAL  5.  Improve PSFS: THE PATIENT SPECIFIC FUNCTIONAL SCALE score by 2 points   Place score of 0-10 (0 = unable to perform activity and 10 = able to perform activity at the same level as before injury or problem)  Activity Date: 08/03/24    Sitting > 2 hours  8    2. Looking up  5    3. Looking up  5    4.      Total Score 18      Total Score = Sum of activity scores/number of activities 18/3= 6  Minimally Detectable Change: 3 points (for single activity); 2 points (for average score) Baseline:  Goal status: INITIAL  6.  Independent in advanced home program  Baseline:  Goal status: INITIAL   PLAN:  PT FREQUENCY: 2x/week  PT DURATION: 8 weeks  PLANNED INTERVENTIONS: 97164- PT Re-evaluation, 97110-Therapeutic exercises, 97530- Therapeutic activity, 97112- Neuromuscular re-education, 97535- Self Care, 02859- Manual therapy, [phone]- Ionotophoresis 4mg /ml Dexamethasone, Patient/Family education, Taping, and Joint mobilization  PLAN FOR NEXT SESSION: review ad progress exercises; continue with spine care and ergonomic education and correction; manual work and modalities as indicated    Terrill Alperin P Nicodemus Denk, PT 08/03/2024, 1:47 PM

## 2024-08-03 ENCOUNTER — Encounter: Payer: Self-pay | Admitting: Rehabilitative and Restorative Service Providers"

## 2024-08-03 ENCOUNTER — Ambulatory Visit: Attending: Sports Medicine | Admitting: Rehabilitative and Restorative Service Providers"

## 2024-08-03 ENCOUNTER — Other Ambulatory Visit: Payer: Self-pay

## 2024-08-03 DIAGNOSIS — M5412 Radiculopathy, cervical region: Secondary | ICD-10-CM | POA: Diagnosis present

## 2024-08-03 DIAGNOSIS — R29898 Other symptoms and signs involving the musculoskeletal system: Secondary | ICD-10-CM

## 2024-08-03 DIAGNOSIS — R293 Abnormal posture: Secondary | ICD-10-CM | POA: Insufficient documentation

## 2024-08-03 DIAGNOSIS — M542 Cervicalgia: Secondary | ICD-10-CM | POA: Diagnosis not present

## 2024-08-03 DIAGNOSIS — M6281 Muscle weakness (generalized): Secondary | ICD-10-CM | POA: Diagnosis not present

## 2024-08-03 DIAGNOSIS — M539 Dorsopathy, unspecified: Secondary | ICD-10-CM

## 2024-08-05 NOTE — Therapy (Signed)
 OUTPATIENT PHYSICAL THERAPY CERVICAL TREATMENT   Patient Name: Kendra Proctor MRN: [id number] DOB:1951/02/09, 73 y.o., female Today's Date: 08/06/2024  END OF SESSION:  PT End of Session - 08/06/24 0845     Visit Number 2    Number of Visits 16    Date for PT Re-Evaluation 09/28/24    Authorization Type [employer] 100 visits/yr with OT copay $30    PT Start Time 0802    PT Stop Time 0844    PT Time Calculation (min) 42 min    Activity Tolerance Patient tolerated treatment well    Behavior During Therapy [facility] for tasks assessed/performed           Past Medical History:  Diagnosis Date   Colon polyp    Coronary artery disease    High cholesterol    Hypertension    Past Surgical History:  Procedure Laterality Date   BREAST EXCISIONAL BIOPSY     KNEE SURGERY     TONSILLECTOMY     Patient Active Problem List   Diagnosis Date Noted   Radiculitis of left cervical region 07/27/2024   Insomnia 01/27/2024   Drug-induced constipation 12/10/2023   Nausea and vomiting 12/10/2023   Overweight 12/10/2023   Hypertension 08/30/2023   Hyperlipidemia 08/30/2023   Class 1 obesity due to excess calories without serious comorbidity with body mass index (BMI) of 31.0 to 31.9 in adult 08/30/2023   History of gout 08/30/2023   Tubular adenoma of colon 11/30/2022   PAF (paroxysmal atrial fibrillation) (HCC) 03/08/2022   Gout of foot 05/27/2020   Acquired hallux rigidus of left foot 05/22/2019   Osteopenia of left hip 11/29/2017   Seborrheic dermatitis of scalp 11/02/2016    PCP: Vermell LITTIE Bologna, PA-C  REFERRING PROVIDER: Dr Debby JINNY Petties  REFERRING DIAG: Radiculitis L cervical region   THERAPY DIAG:  Cervical dysfunction  Other symptoms and signs involving the musculoskeletal system  Abnormal posture  Muscle weakness (generalized)  Rationale for Evaluation and Treatment: Rehabilitation  ONSET DATE: 05/31/24  SUBJECTIVE:                                                                                                                                                                                                          SUBJECTIVE STATEMENT: In pain when I'm awake.  Eval: Patient reports that she started having pain in the neck in the past 2 months. She noticed sudden sharp pain in the L neck and into the shoulder. She treated symptoms with OTC meds with some improvement. She tried prescription  meds and that created side effects. Now only taking muscle relaxants.  Hand dominance: Right  PERTINENT HISTORY:  HTN; a-fib; has a watchman mesh in her heart to prevent blood clots; chronic gout; osteopenia  PAIN:  Are you having pain? Yes: NPRS scale: 4/10; best 0/10; worst 5-6/10  Pain location: L neck to shoulder  Pain description: dull; throbbing; may have sharp pain but it doesn't last long Aggravating factors: prolonged sitting > 2 hours; worse during the day Relieving factors: meds  PRECAUTIONS: osteopenia cervical spine   RED FLAGS: None     WEIGHT BEARING RESTRICTIONS: No  FALLS:  Has patient fallen in last 6 months? 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  DIAGNOSTIC FINDINGS:  Xray 07/27/24 cervical: Osteopenia. The cervical spine is visualized from C1-C6. Cervical alignment is maintained. Vertebral body heights are maintained: no evidence of acute fracture. Moderate intervertebral disc space height loss of C5-6. Mild intervertebral disc space height loss at 4-5. Multilevel facet arthropathy and uncovertebral  hypertrophy results in moderate RIGHT and mild-to-moderate LEFT osseous neuroforaminal narrowing at C3-4 and C5-6. Mild osseous neuroforaminal narrowing at bilateral C4-5. No prevertebral soft tissue swelling. Visualized thorax is unremarkable.   IMPRESSION: Multilevel degenerative changes of the cervical spine most pronounced at C5-6.  PATIENT SURVEYS:  PSFS: THE PATIENT SPECIFIC FUNCTIONAL SCALE  Place score of 0-10 (0 = unable to perform activity and 10 = able to perform activity at the same level as before injury or problem)  Activity Date: 08/03/24    Sitting > 2 hours  8    2. Looking up  5    3. Looking up  5    4.      Total Score 18      Total Score = Sum of activity scores/number of activities 18/3= 6  Minimally Detectable Change: 3 points (for single activity); 2 points (for average score)  [name] (nd). The Patient Specific Functional Scale . Retrieved from [email or web address]   COGNITION: Overall cognitive status: Within functional limits for tasks assessed  SENSATION: Intermittent tingling in the arm above above elbow - lasting a few seconds to 1 min   POSTURE: rounded shoulders, forward head, increased thoracic kyphosis, and flexed trunk   PALPATION: Tight with PA and lateral mobs L through the thoracic spine Muscular tightness L > R pecs; upper trap; leveator    CERVICAL ROM:   Active ROM A/PROM (deg) eval  Flexion 52 pain  Extension 34 pain  Right lateral flexion 14 pull   Left lateral flexion 25  Right rotation 58  Left rotation 52 pull    (Blank rows = not tested)  UPPER EXTREMITY ROM: limited end ranges elevation; rotation  Active ROM Right eval Left eval  Shoulder flexion    Shoulder extension    Shoulder abduction    Shoulder adduction    Shoulder extension    Shoulder internal rotation    Shoulder external rotation    Elbow flexion    Elbow extension    Wrist  flexion    Wrist extension    Wrist ulnar  deviation    Wrist radial deviation    Wrist pronation    Wrist supination     (Blank rows = not tested)  UPPER EXTREMITY MMT:  MMT Right eval Left eval  Shoulder flexion    Shoulder extension    Shoulder abduction    Shoulder adduction    Shoulder extension    Shoulder internal rotation    Shoulder external rotation    Middle trapezius 4 4  Lower trapezius 4 4  Elbow flexion    Elbow extension    Wrist flexion    Wrist extension    Wrist ulnar deviation    Wrist radial deviation    Wrist pronation    Wrist supination    Grip strength     (Blank rows = not tested)  CERVICAL SPECIAL TESTS:  Spurling's test: Negative, and Distraction test: Negative  TREATMENT DATE:                                                                                                                           08/06/24 3 way doorway stretch 3x 30 sec ea at 60 deg (assessed other angles but no stretch) Scapular retraction in doorway x 10 ER with retraction in doorway x 10 Cervical retraction 5 sec hold x 10 L UT stretch 2x 30 sec (attempted R side but pt got tingling into L UE)  Trigger Point Dry Needling  Initial Treatment: Pt instructed on Dry Needling rational, procedures, and possible side effects. Pt instructed to expect mild to moderate muscle soreness later in the day and/or into the next day.  Pt instructed in methods to reduce muscle soreness. Pt instructed to continue prescribed HEP. Because Dry Needling was performed over or adjacent to a lung field, pt was educated on S/S of pneumothorax and to seek immediate medical attention should they occur.  Patient was educated on signs and symptoms of infection and other risk factors and advised to seek medical attention should they occur.  Patient verbalized understanding of these instructions and education.   Patient Verbal Consent Given: Yes Education Handout Provided: Yes Muscles Treated: B UT  and cervical multifidi Electrical Stimulation Performed: No Treatment Response/Outcome: Utilized skilled palpation to identify bony landmarks and trigger points.  Able to illicit twitch response and muscle elongation.  Soft tissue mobilization to muscles needled to further promote tissue elongation and decreased pain.       Eval; HEP   PATIENT EDUCATION:  Education details: POC; HEP Person educated: Patient Education method: Explanation, Demonstration, Tactile cues, Verbal cues, and Handouts Education comprehension: verbalized understanding, returned demonstration, verbal cues required, tactile cues required, and needs further education  HOME EXERCISE PROGRAM: Access Code: [email or web address] URL: [email or web address] Date: 08/06/2024 Prepared by: Mliss  Exercises - Seated Cervical Retraction  - 2 x daily - 7 x weekly - 1-2 sets - 5-10 reps - 10 sec  hold - Seated Passive Cervical Retraction  - 2 x daily - 7 x weekly -  1 sets - 5-10 reps - 5-10 sec  hold - Seated Scapular Retraction  - 2 x daily - 7 x weekly - 1-2 sets - 10 reps - 10 sec  hold - Shoulder External Rotation and Scapular Retraction  - 1-2 x daily - 7 x weekly - 1 sets - 10 reps - 3-5 sec   hold - Doorway Pec Stretch at 60 Degrees Abduction  - 3 x daily - 7 x weekly - 1 sets - 3 reps - Doorway Pec Stretch at 90 Degrees Abduction  - 3 x daily - 7 x weekly - 1 sets - 3 reps - 30 seconds  hold - Doorway Pec Stretch at 120 Degrees Abduction  - 3 x daily - 7 x weekly - 1 sets - 3 reps - 30 second hold  hold - Standing Pectoral Release with Ball at Wall  - 3-4 x daily - 7 x weekly - Standing Infraspinatus/Teres Minor Release with Ball at [employer]  - 2 x daily - 7 x weekly - Seated Cervical Sidebending Stretch  - 2 x daily - 7 x weekly - 1 sets - 3 reps - 30-60 sec hold  Patient Education - Office Posture - Standing Infraspinatus/Teres Minor Release with Ball at Wall  - 2 x daily - 7 x weekly    ASSESSMENT:  CLINICAL  IMPRESSION: Patient with good demonstration of HEP. Progressed with UT stretch for left side only as R side caused L radicular sx. Excellent response to initial trial of DN with patient reporting no pain with cervical flexion after and less pain with extension. She continues to demonstrate potential for improvement and would benefit from continued skilled therapy to address impairments.     Eval: Patient is a 73 y.o. female who was seen today for physical therapy evaluation and treatment for L cervical radiculitis which was sudden onset with no known injury. Patient presents with poor posture and alignment; limited cervical and thoracic mobility/ROM; end range pain and tightness with cervical motions; tightness end range shoulder elevation and rotation; muscular tightness in the ant/lat/posterior cervical musculature, pec, upper traps. Patient has pain with sitting > 2 hours and intermittent radicular tingling L UE. Patient will benefit form PT to address problems identified.   OBJECTIVE IMPAIRMENTS: decreased activity tolerance, decreased mobility, decreased ROM, decreased strength, hypomobility, increased fascial restrictions, improper body mechanics, postural dysfunction, and pain.   ACTIVITY LIMITATIONS: sitting and reach over head  PARTICIPATION LIMITATIONS: occupation and yard work  PERSONAL FACTORS: Fitness, Past/current experiences, Profession, and Time since onset of injury/illness/exacerbation are also affecting patient's functional outcome.   REHAB POTENTIAL: Good  CLINICAL DECISION MAKING: Evolving/moderate complexity  EVALUATION COMPLEXITY: Moderate   GOALS: Goals reviewed with patient? Yes  SHORT TERM GOALS: Target date: 08/31/2024   Independent in initial HEP  Baseline:  Goal status: INITIAL  2.  Resolution of radicular tingling L UE  Baseline:  Goal status: INITIAL  3.  Increase cervical ROM by 2-5 degrees in limited planes of motion  Baseline:  Goal status:  INITIAL   LONG TERM GOALS: Target date: 09/28/2024   Patient reports 80-90% improvement in cervical pain and discomfort  Baseline:  Goal status: INITIAL  2.  Improve posture and alignment with engagement of posterior shoulder girdle musculature and 5/5 strength in middle and lower traps Baseline:  Goal status: INITIAL  3.  Patient verbalizes and or demonstrates good posture & alignment for desk/computer work & reading to increase sitting time to > 2-3 hours Baseline:  Goal status: INITIAL  4.  Full, pain free cervical ROM  Baseline:  Goal status: INITIAL  5.  Improve PSFS: THE PATIENT SPECIFIC FUNCTIONAL SCALE score by 2 points   Place score of 0-10 (0 = unable to perform activity and 10 = able to perform activity at the same level as before injury or problem)  Activity Date: 08/03/24    Sitting > 2 hours  8    2. Looking up  5    3. Looking up  5    4.      Total Score 18      Total Score = Sum of activity scores/number of activities 18/3= 6  Minimally Detectable Change: 3 points (for single activity); 2 points (for average score) Baseline:  Goal status: INITIAL  6.  Independent in advanced home program  Baseline:  Goal status: INITIAL   PLAN:  PT FREQUENCY: 2x/week  PT DURATION: 8 weeks  PLANNED INTERVENTIONS: 97164- PT Re-evaluation, 97110-Therapeutic exercises, 97530- Therapeutic activity, 97112- Neuromuscular re-education, 97535- Self Care, [id number]- Manual therapy, 97033- Ionotophoresis 4mg /ml Dexamethasone, Patient/Family education, Taping, and Joint mobilization  PLAN FOR NEXT SESSION: assess DN response, trial of traction? (Not on POC), review ad progress exercises; continue with spine care and ergonomic education and correction; manual work and modalities as indicated   [employer], PT  08/06/2024, 8:47 AM

## 2024-08-06 ENCOUNTER — Ambulatory Visit: Admitting: Physical Therapy

## 2024-08-06 ENCOUNTER — Encounter: Payer: Self-pay | Admitting: Physical Therapy

## 2024-08-06 DIAGNOSIS — M539 Dorsopathy, unspecified: Secondary | ICD-10-CM

## 2024-08-06 DIAGNOSIS — R29898 Other symptoms and signs involving the musculoskeletal system: Secondary | ICD-10-CM

## 2024-08-06 DIAGNOSIS — R293 Abnormal posture: Secondary | ICD-10-CM

## 2024-08-06 DIAGNOSIS — M6281 Muscle weakness (generalized): Secondary | ICD-10-CM

## 2024-08-06 DIAGNOSIS — M5412 Radiculopathy, cervical region: Secondary | ICD-10-CM | POA: Diagnosis not present

## 2024-08-08 ENCOUNTER — Ambulatory Visit

## 2024-08-08 DIAGNOSIS — M542 Cervicalgia: Secondary | ICD-10-CM | POA: Diagnosis not present

## 2024-08-08 DIAGNOSIS — M5412 Radiculopathy, cervical region: Secondary | ICD-10-CM

## 2024-08-10 ENCOUNTER — Ambulatory Visit: Admitting: Rehabilitative and Restorative Service Providers"

## 2024-08-12 ENCOUNTER — Encounter: Admitting: Rehabilitative and Restorative Service Providers"

## 2024-08-12 ENCOUNTER — Encounter: Payer: Self-pay | Admitting: Physical Therapy

## 2024-08-12 ENCOUNTER — Ambulatory Visit: Admitting: Physical Therapy

## 2024-08-12 DIAGNOSIS — M6281 Muscle weakness (generalized): Secondary | ICD-10-CM

## 2024-08-12 DIAGNOSIS — M539 Dorsopathy, unspecified: Secondary | ICD-10-CM

## 2024-08-12 DIAGNOSIS — R29898 Other symptoms and signs involving the musculoskeletal system: Secondary | ICD-10-CM

## 2024-08-12 DIAGNOSIS — R293 Abnormal posture: Secondary | ICD-10-CM

## 2024-08-12 DIAGNOSIS — M5412 Radiculopathy, cervical region: Secondary | ICD-10-CM | POA: Diagnosis not present

## 2024-08-12 NOTE — Therapy (Signed)
 OUTPATIENT PHYSICAL THERAPY CERVICAL TREATMENT   Patient Name: Kendra Proctor MRN: [id number] DOB:1951/01/31, 73 y.o., female Today's Date: 08/12/2024  END OF SESSION:  PT End of Session - 08/12/24 1616     Visit Number 3    Number of Visits 16    Date for PT Re-Evaluation 09/28/24    Authorization Type [employer] 100 visits/yr with OT copay $30    PT Start Time 1530    PT Stop Time 1614    PT Time Calculation (min) 44 min    Activity Tolerance Patient tolerated treatment well    Behavior During Therapy WFL for tasks assessed/performed            Past Medical History:  Diagnosis Date   Colon polyp    Coronary artery disease    High cholesterol    Hypertension    Past Surgical History:  Procedure Laterality Date   BREAST EXCISIONAL BIOPSY     KNEE SURGERY     TONSILLECTOMY     Patient Active Problem List   Diagnosis Date Noted   Radiculitis of left cervical region 07/27/2024   Insomnia 01/27/2024   Drug-induced constipation 12/10/2023   Nausea and vomiting 12/10/2023   Overweight 12/10/2023   Hypertension 08/30/2023   Hyperlipidemia 08/30/2023   Class 1 obesity due to excess calories without serious comorbidity with body mass index (BMI) of 31.0 to 31.9 in adult 08/30/2023   History of gout 08/30/2023   Tubular adenoma of colon 11/30/2022   PAF (paroxysmal atrial fibrillation) (HCC) 03/08/2022   Gout of foot 05/27/2020   Acquired hallux rigidus of left foot 05/22/2019   Osteopenia of left hip 11/29/2017   Seborrheic dermatitis of scalp 11/02/2016    PCP: Kendra LITTIE Bologna, PA-C  REFERRING PROVIDER: Dr Kendra Proctor  REFERRING DIAG: Radiculitis L cervical region   THERAPY DIAG:  Cervical dysfunction  Other symptoms and signs involving the musculoskeletal system  Abnormal posture  Muscle weakness (generalized)  Rationale for Evaluation and Treatment: Rehabilitation  ONSET DATE: 05/31/24  SUBJECTIVE:                                                                                                                                                                                                          SUBJECTIVE STATEMENT: Pt states the needling worked really well and she would like to try it again. She is thinking of getting a standing desk  Eval: Patient reports that she started having pain in the neck in the past 2 months. She noticed sudden sharp pain in  the L neck and into the shoulder. She treated symptoms with OTC meds with some improvement. She tried prescription meds and that created side effects. Now only taking muscle relaxants.  Hand dominance: Right  PERTINENT HISTORY:  HTN; a-fib; has a watchman mesh in her heart to prevent blood clots; chronic gout; osteopenia  PAIN:  Are you having pain? Yes: NPRS scale: 4/10; best 0/10; worst 5-6/10  Pain location: L neck to shoulder  Pain description: dull; throbbing; may have sharp pain but it doesn't last long Aggravating factors: prolonged sitting > 2 hours; worse during the day Relieving factors: meds  PRECAUTIONS: osteopenia cervical spine   RED FLAGS: None     WEIGHT BEARING RESTRICTIONS: No  FALLS:  Has patient fallen in last 6 months? 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  DIAGNOSTIC FINDINGS:  Xray 07/27/24 cervical: Osteopenia. The cervical spine is visualized from C1-C6. Cervical alignment is maintained. Vertebral body heights are maintained: no evidence of acute fracture. Moderate intervertebral disc space height loss of C5-6.  Mild intervertebral disc space height loss at 4-5. Multilevel facet arthropathy and uncovertebral hypertrophy results in moderate RIGHT and mild-to-moderate LEFT osseous neuroforaminal narrowing at C3-4 and C5-6. Mild osseous neuroforaminal narrowing at bilateral C4-5. No prevertebral soft tissue swelling. Visualized thorax is unremarkable.   IMPRESSION: Multilevel degenerative changes of the cervical spine most pronounced at C5-6.  PATIENT SURVEYS:  PSFS: THE PATIENT SPECIFIC FUNCTIONAL SCALE  Place score of 0-10 (0 = unable to perform activity and 10 = able to perform activity at the same level as before injury or problem)  Activity Date: 08/03/24    Sitting > 2 hours  8    2. Looking up  5    3. Looking up  5    4.      Total Score 18      Total Score = Sum of activity scores/number of activities 18/3= 6  Minimally Detectable Change: 3 points (for single activity); 2 points (for average score)  [name] (nd). The Patient Specific Functional Scale . Retrieved from [email or web address]   COGNITION: Overall cognitive status: Within functional limits for tasks assessed  SENSATION: Intermittent tingling in the arm above above elbow - lasting a few seconds to 1 min   POSTURE: rounded shoulders, forward head, increased thoracic kyphosis, and flexed trunk   PALPATION: Tight with PA and lateral mobs L through the thoracic spine Muscular tightness L > R pecs; upper trap; leveator    CERVICAL ROM:   Active ROM A/PROM (deg) eval  Flexion 52 pain  Extension 34 pain  Right lateral flexion 14 pull   Left lateral flexion 25  Right rotation 58  Left rotation 52 pull    (Blank rows = not tested)  UPPER EXTREMITY ROM: limited end ranges elevation; rotation  Active ROM Right eval Left eval  Shoulder flexion    Shoulder extension    Shoulder abduction    Shoulder adduction    Shoulder extension    Shoulder  internal rotation    Shoulder external rotation    Elbow flexion  Elbow extension    Wrist flexion    Wrist extension    Wrist ulnar deviation    Wrist radial deviation    Wrist pronation    Wrist supination     (Blank rows = not tested)  UPPER EXTREMITY MMT:  MMT Right eval Left eval  Shoulder flexion    Shoulder extension    Shoulder abduction    Shoulder adduction    Shoulder extension    Shoulder internal rotation    Shoulder external rotation    Middle trapezius 4 4  Lower trapezius 4 4  Elbow flexion    Elbow extension    Wrist flexion    Wrist extension    Wrist ulnar deviation    Wrist radial deviation    Wrist pronation    Wrist supination    Grip strength     (Blank rows = not tested)  CERVICAL SPECIAL TESTS:  Spurling's test: Negative, and Distraction test: Negative  [facility] Adult PT Treatment:                                                DATE: 08/12/24 Therapeutic Exercise: Doorway stretch 60 degrees 3 x 30 sec Scap retraction in doorway x 10 Bilat ER x 10 UT stretch Manual Therapy: PA mobs grade 2-3 T1-5 STM bilat UT and cervical paraspinals Trigger Point Dry Needling  Subsequent Treatment: Instructions provided previously at initial dry needling treatment.   Patient Verbal Consent Given: Yes Education Handout Provided: Previously Provided Muscles Treated: bilat UT, cervical paraspinals Electrical Stimulation Performed: No Treatment Response/Outcome: multiple twitch responses, palpable increase in muscle lengt     TREATMENT DATE:                                                                                                                           08/06/24 3 way doorway stretch 3x 30 sec ea at 60 deg (assessed other angles but no stretch) Scapular retraction in doorway x 10 ER with retraction in doorway x 10 Cervical retraction 5 sec hold x 10 L UT stretch 2x 30 sec (attempted R side but pt got tingling into L UE)  Trigger Point Dry  Needling  Initial Treatment: Pt instructed on Dry Needling rational, procedures, and possible side effects. Pt instructed to expect mild to moderate muscle soreness later in the day and/or into the next day.  Pt instructed in methods to reduce muscle soreness. Pt instructed to continue prescribed HEP. Because Dry Needling was performed over or adjacent to a lung field, pt was educated on S/S of pneumothorax and to seek immediate medical attention should they occur.  Patient was educated on signs and symptoms of infection and other risk factors and advised to seek medical attention should they occur.  Patient verbalized understanding of these instructions and education.   Patient  Verbal Consent Given: Yes Education Handout Provided: Yes Muscles Treated: B UT and cervical multifidi Electrical Stimulation Performed: No Treatment Response/Outcome: Utilized skilled palpation to identify bony landmarks and trigger points.  Able to illicit twitch response and muscle elongation.  Soft tissue mobilization to muscles needled to further promote tissue elongation and decreased pain.        PATIENT EDUCATION:  Education details: POC; HEP Person educated: Patient Education method: Programmer, multimedia, Demonstration, Actor cues, Verbal cues, and Handouts Education comprehension: verbalized understanding, returned demonstration, verbal cues required, tactile cues required, and needs further education  HOME EXERCISE PROGRAM: Access Code: [email or web address] URL: [email or web address] Date: 08/06/2024 Prepared by: Mliss  Exercises - Seated Cervical Retraction  - 2 x daily - 7 x weekly - 1-2 sets - 5-10 reps - 10 sec  hold - Seated Passive Cervical Retraction  - 2 x daily - 7 x weekly - 1 sets - 5-10 reps - 5-10 sec  hold - Seated Scapular Retraction  - 2 x daily - 7 x weekly - 1-2 sets - 10 reps - 10 sec  hold - Shoulder External Rotation and Scapular Retraction  - 1-2 x daily - 7 x weekly - 1 sets -  10 reps - 3-5 sec   hold - Doorway Pec Stretch at 60 Degrees Abduction  - 3 x daily - 7 x weekly - 1 sets - 3 reps - Doorway Pec Stretch at 90 Degrees Abduction  - 3 x daily - 7 x weekly - 1 sets - 3 reps - 30 seconds  hold - Doorway Pec Stretch at 120 Degrees Abduction  - 3 x daily - 7 x weekly - 1 sets - 3 reps - 30 second hold  hold - Standing Pectoral Release with Ball at Wall  - 3-4 x daily - 7 x weekly - Standing Infraspinatus/Teres Minor Release with Ball at [employer]  - 2 x daily - 7 x weekly - Seated Cervical Sidebending Stretch  - 2 x daily - 7 x weekly - 1 sets - 3 reps - 30-60 sec hold  Patient Education - Office Posture - Standing Infraspinatus/Teres Minor Release with Ball at [employer]  - 2 x daily - 7 x weekly    ASSESSMENT:  CLINICAL IMPRESSION: Pt with noted hypomoblity in upper thoracic spine which responds well to jt mobs. Pt continues with good response to dry needling and manual therapy. Plan to assess need for further dry needling treatment at next visit   Eval: Patient is a 73 y.o. female who was seen today for physical therapy evaluation and treatment for L cervical radiculitis which was sudden onset with no known injury. Patient presents with poor posture and alignment; limited cervical and thoracic mobility/ROM; end range pain and tightness with cervical motions; tightness end range shoulder elevation and rotation; muscular tightness in the ant/lat/posterior cervical musculature, pec, upper traps. Patient has pain with sitting > 2 hours and intermittent radicular tingling L UE. Patient will benefit form PT to address problems identified.   OBJECTIVE IMPAIRMENTS: decreased activity tolerance, decreased mobility, decreased ROM, decreased strength, hypomobility, increased fascial restrictions, improper body mechanics, postural dysfunction, and pain.     GOALS: Goals reviewed with patient? Yes  SHORT TERM GOALS: Target date: 08/31/2024   Independent in initial HEP   Baseline:  Goal status: INITIAL  2.  Resolution of radicular tingling L UE  Baseline:  Goal status: INITIAL  3.  Increase cervical ROM by 2-5 degrees in limited planes of motion  Baseline:  Goal status: INITIAL   LONG TERM GOALS: Target date: 09/28/2024   Patient reports 80-90% improvement in cervical pain and discomfort  Baseline:  Goal status: INITIAL  2.  Improve posture and alignment with engagement of posterior shoulder girdle musculature and 5/5 strength in middle and lower traps Baseline:  Goal status: INITIAL  3.  Patient verbalizes and or demonstrates good posture & alignment for desk/computer work & reading to increase sitting time to > 2-3 hours Baseline:  Goal status: INITIAL  4.  Full, pain free cervical ROM  Baseline:  Goal status: INITIAL  5.  Improve PSFS: THE PATIENT SPECIFIC FUNCTIONAL SCALE score by 2 points   Place score of 0-10 (0 = unable to perform activity and 10 = able to perform activity at the same level as before injury or problem)  Activity Date: 08/03/24    Sitting > 2 hours  8    2. Looking up  5    3. Looking up  5    4.      Total Score 18      Total Score = Sum of activity scores/number of activities 18/3= 6  Minimally Detectable Change: 3 points (for single activity); 2 points (for average score) Baseline:  Goal status: INITIAL  6.  Independent in advanced home program  Baseline:  Goal status: INITIAL   PLAN:  PT FREQUENCY: 2x/week  PT DURATION: 8 weeks  PLANNED INTERVENTIONS: 97164- PT Re-evaluation, 97110-Therapeutic exercises, 97530- Therapeutic activity, 97112- Neuromuscular re-education, 97535- Self Care, [id number]- Manual therapy, [phone]- Ionotophoresis 4mg /ml Dexamethasone, Patient/Family education, Taping, and Joint mobilization  PLAN FOR NEXT SESSION: assess DN response,review and progress exercises; continue with spine care and ergonomic education and correction; manual work and modalities as indicated   Darice Conine, PT,DPT08/13/254:17 PM

## 2024-08-18 ENCOUNTER — Ambulatory Visit: Admitting: Rehabilitative and Restorative Service Providers"

## 2024-08-19 ENCOUNTER — Ambulatory Visit: Payer: Self-pay | Admitting: Physical Therapy

## 2024-08-19 ENCOUNTER — Encounter: Payer: Self-pay | Admitting: Physical Therapy

## 2024-08-19 DIAGNOSIS — M539 Dorsopathy, unspecified: Secondary | ICD-10-CM

## 2024-08-19 DIAGNOSIS — M6281 Muscle weakness (generalized): Secondary | ICD-10-CM

## 2024-08-19 DIAGNOSIS — R29898 Other symptoms and signs involving the musculoskeletal system: Secondary | ICD-10-CM

## 2024-08-19 DIAGNOSIS — M5412 Radiculopathy, cervical region: Secondary | ICD-10-CM | POA: Diagnosis not present

## 2024-08-19 DIAGNOSIS — R293 Abnormal posture: Secondary | ICD-10-CM

## 2024-08-19 NOTE — Therapy (Signed)
 OUTPATIENT PHYSICAL THERAPY CERVICAL TREATMENT   Patient Name: Kendra Proctor MRN: [id number] DOB:10-28-1951, 73 y.o., female Today's Date: 08/19/2024  END OF SESSION:  PT End of Session - 08/19/24 1611     Visit Number 4    Number of Visits 16    Date for PT Re-Evaluation 09/28/24    Authorization Type [employer] 100 visits/yr with OT copay $30    PT Start Time 1530    PT Stop Time 1611    PT Time Calculation (min) 41 min    Activity Tolerance Patient tolerated treatment well    Behavior During Therapy [facility] for tasks assessed/performed             Past Medical History:  Diagnosis Date   Colon polyp    Coronary artery disease    High cholesterol    Hypertension    Past Surgical History:  Procedure Laterality Date   BREAST EXCISIONAL BIOPSY     KNEE SURGERY     TONSILLECTOMY     Patient Active Problem List   Diagnosis Date Noted   Radiculitis of left cervical region 07/27/2024   Insomnia 01/27/2024   Drug-induced constipation 12/10/2023   Nausea and vomiting 12/10/2023   Overweight 12/10/2023   Hypertension 08/30/2023   Hyperlipidemia 08/30/2023   Class 1 obesity due to excess calories without serious comorbidity with body mass index (BMI) of 31.0 to 31.9 in adult 08/30/2023   History of gout 08/30/2023   Tubular adenoma of colon 11/30/2022   PAF (paroxysmal atrial fibrillation) (HCC) 03/08/2022   Gout of foot 05/27/2020   Acquired hallux rigidus of left foot 05/22/2019   Osteopenia of left hip 11/29/2017   Seborrheic dermatitis of scalp 11/02/2016    PCP: Kendra LITTIE Bologna, PA-C  REFERRING PROVIDER: Dr Kendra Proctor  REFERRING DIAG: Radiculitis L cervical region   THERAPY DIAG:  Cervical dysfunction  Other symptoms and signs involving the musculoskeletal system  Abnormal posture  Muscle weakness (generalized)  Rationale for Evaluation and Treatment: Rehabilitation  ONSET DATE: 05/31/24  SUBJECTIVE:                                                                                                                                                                                                          SUBJECTIVE STATEMENT: Pt states the needling worked really well and she would like to try it again. She thinks she is almost there. She states she wakes up in the morning with no pain. Only slight difficulty with looking up. She knows working is causing most of  her problems  Eval: Patient reports that she started having pain in the neck in the past 2 months. She noticed sudden sharp pain in the L neck and into the shoulder. She treated symptoms with OTC meds with some improvement. She tried prescription meds and that created side effects. Now only taking muscle relaxants.  Hand dominance: Right  PERTINENT HISTORY:  HTN; a-fib; has a watchman mesh in her heart to prevent blood clots; chronic gout; osteopenia  PAIN:  Are you having pain? Yes: NPRS scale: 2/10; best 0/10; worst 5-6/10  Pain location: L neck to shoulder  Pain description: dull; throbbing; may have sharp pain but it doesn't last long Aggravating factors: prolonged sitting > 2 hours; worse during the day Relieving factors: meds  PRECAUTIONS: osteopenia cervical spine   RED FLAGS: None     WEIGHT BEARING RESTRICTIONS: No  FALLS:  Has patient fallen in last 6 months? 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  DIAGNOSTIC FINDINGS:  Xray 07/27/24 cervical: Osteopenia. The cervical spine is visualized from C1-C6. Cervical alignment is  maintained. Vertebral body heights are maintained: no evidence of acute fracture. Moderate intervertebral disc space height loss of C5-6. Mild intervertebral disc space height loss at 4-5. Multilevel facet arthropathy and uncovertebral hypertrophy results in moderate RIGHT and mild-to-moderate LEFT osseous neuroforaminal narrowing at C3-4 and C5-6. Mild osseous neuroforaminal narrowing at bilateral C4-5. No prevertebral soft tissue swelling. Visualized thorax is unremarkable.   IMPRESSION: Multilevel degenerative changes of the cervical spine most pronounced at C5-6.  PATIENT SURVEYS:  PSFS: THE PATIENT SPECIFIC FUNCTIONAL SCALE  Place score of 0-10 (0 = unable to perform activity and 10 = able to perform activity at the same level as before injury or problem)  Activity Date: 08/03/24 Date 08/19/24   Sitting > 2 hours  8    2. Looking up  5    3. Looking up  5 9   4.      Total Score 18      Total Score = Sum of activity scores/number of activities 18/3= 6  Minimally Detectable Change: 3 points (for single activity); 2 points (for average score)  [name] (nd). The Patient Specific Functional Scale . Retrieved from [email or web address]   COGNITION: Overall cognitive status: Within functional limits for tasks assessed  SENSATION: Intermittent tingling in the arm above above elbow - lasting a few seconds to 1 min   POSTURE: rounded shoulders, forward head, increased thoracic kyphosis, and flexed trunk   PALPATION: Tight with PA and lateral mobs L through the thoracic spine Muscular tightness L > R pecs; upper trap; leveator    CERVICAL ROM:   Active ROM A/PROM (deg) eval  Flexion 52 pain  Extension 34 pain  Right lateral flexion 14 pull   Left lateral flexion 25  Right rotation 58  Left rotation 52 pull    (Blank rows = not tested)  UPPER EXTREMITY ROM: limited end ranges elevation;  rotation  Active ROM Right eval Left eval  Shoulder flexion    Shoulder extension    Shoulder abduction  Shoulder adduction    Shoulder extension    Shoulder internal rotation    Shoulder external rotation    Elbow flexion    Elbow extension    Wrist flexion    Wrist extension    Wrist ulnar deviation    Wrist radial deviation    Wrist pronation    Wrist supination     (Blank rows = not tested)  UPPER EXTREMITY MMT:  MMT Right eval Left eval  Shoulder flexion    Shoulder extension    Shoulder abduction    Shoulder adduction    Shoulder extension    Shoulder internal rotation    Shoulder external rotation    Middle trapezius 4 4  Lower trapezius 4 4  Elbow flexion    Elbow extension    Wrist flexion    Wrist extension    Wrist ulnar deviation    Wrist radial deviation    Wrist pronation    Wrist supination    Grip strength     (Blank rows = not tested)  CERVICAL SPECIAL TESTS:  Spurling's test: Negative, and Distraction test: Negative  [facility] Adult PT Treatment:                                                DATE: 08/19/24 Therapeutic Exercise: UT stretch Bilat ER x 10 Y lift off of wall x 10 - tactile cues for lower trap activation Thread the needle at wall x 10 bilat Manual Therapy: PA jt mobs grade 2-3 T1-5 STM bilat UT and cervical paraspinals Trigger Point Dry Needling  Subsequent Treatment: Instructions provided previously at initial dry needling treatment.   Patient Verbal Consent Given: Yes Education Handout Provided: Previously Provided Muscles Treated: Lt UT, cervical paraspinals Electrical Stimulation Performed: No Treatment Response/Outcome: twitch response   [facility] Adult PT Treatment:                                                DATE: 08/12/24 Therapeutic Exercise: Doorway stretch 60 degrees 3 x 30 sec Scap retraction in doorway x 10 Bilat ER x 10 UT stretch Manual Therapy: PA mobs grade 2-3 T1-5 STM bilat UT and cervical  paraspinals Trigger Point Dry Needling  Subsequent Treatment: Instructions provided previously at initial dry needling treatment.   Patient Verbal Consent Given: Yes Education Handout Provided: Previously Provided Muscles Treated: bilat UT, cervical paraspinals Electrical Stimulation Performed: No Treatment Response/Outcome: multiple twitch responses, palpable increase in muscle lengt     TREATMENT DATE:                                                                                                                           08/06/24 3  way doorway stretch 3x 30 sec ea at 60 deg (assessed other angles but no stretch) Scapular retraction in doorway x 10 ER with retraction in doorway x 10 Cervical retraction 5 sec hold x 10 L UT stretch 2x 30 sec (attempted R side but pt got tingling into L UE)  Trigger Point Dry Needling  Initial Treatment: Pt instructed on Dry Needling rational, procedures, and possible side effects. Pt instructed to expect mild to moderate muscle soreness later in the day and/or into the next day.  Pt instructed in methods to reduce muscle soreness. Pt instructed to continue prescribed HEP. Because Dry Needling was performed over or adjacent to a lung field, pt was educated on S/S of pneumothorax and to seek immediate medical attention should they occur.  Patient was educated on signs and symptoms of infection and other risk factors and advised to seek medical attention should they occur.  Patient verbalized understanding of these instructions and education.   Patient Verbal Consent Given: Yes Education Handout Provided: Yes Muscles Treated: B UT and cervical multifidi Electrical Stimulation Performed: No Treatment Response/Outcome: Utilized skilled palpation to identify bony landmarks and trigger points.  Able to illicit twitch response and muscle elongation.  Soft tissue mobilization to muscles needled to further promote tissue elongation and decreased pain.         PATIENT EDUCATION:  Education details: POC; HEP Person educated: Patient Education method: Programmer, multimedia, Demonstration, Actor cues, Verbal cues, and Handouts Education comprehension: verbalized understanding, returned demonstration, verbal cues required, tactile cues required, and needs further education  HOME EXERCISE PROGRAM: Access Code: [email or web address] URL: [email or web address] Date: 08/19/2024 Prepared by: Darice Conine  Exercises - Seated Cervical Retraction  - 2 x daily - 7 x weekly - 1-2 sets - 5-10 reps - 10 sec  hold - Seated Passive Cervical Retraction  - 2 x daily - 7 x weekly - 1 sets - 5-10 reps - 5-10 sec  hold - Seated Scapular Retraction  - 2 x daily - 7 x weekly - 1-2 sets - 10 reps - 10 sec  hold - Shoulder External Rotation and Scapular Retraction  - 1-2 x daily - 7 x weekly - 1 sets - 10 reps - 3-5 sec   hold - Doorway Pec Stretch at 60 Degrees Abduction  - 3 x daily - 7 x weekly - 1 sets - 3 reps - Doorway Pec Stretch at 90 Degrees Abduction  - 3 x daily - 7 x weekly - 1 sets - 3 reps - 30 seconds  hold - Doorway Pec Stretch at 120 Degrees Abduction  - 3 x daily - 7 x weekly - 1 sets - 3 reps - 30 second hold  hold - Standing Pectoral Release with Ball at Wall  - 3-4 x daily - 7 x weekly - Standing Infraspinatus/Teres Minor Release with Ball at [employer]  - 2 x daily - 7 x weekly - Seated Cervical Sidebending Stretch  - 2 x daily - 7 x weekly - 1 sets - 3 reps - 30-60 sec hold - Plank with Thoracic Rotation on Counter  - 1 x daily - 7 x weekly - 3 sets - 10 reps - Low Trap Setting at Wall  - 1 x daily - 7 x weekly - 3 sets - 10 reps    ASSESSMENT:  CLINICAL IMPRESSION: Pt continues with good response to dry needling. Added thread the needle to progress thoracic mobility with good response. Updated HEP. Pt plans to wait 2 weeks  before next session due to feeling much better.   Eval: Patient is a 73 y.o. female who was seen today for physical  therapy evaluation and treatment for L cervical radiculitis which was sudden onset with no known injury. Patient presents with poor posture and alignment; limited cervical and thoracic mobility/ROM; end range pain and tightness with cervical motions; tightness end range shoulder elevation and rotation; muscular tightness in the ant/lat/posterior cervical musculature, pec, upper traps. Patient has pain with sitting > 2 hours and intermittent radicular tingling L UE. Patient will benefit form PT to address problems identified.   OBJECTIVE IMPAIRMENTS: decreased activity tolerance, decreased mobility, decreased ROM, decreased strength, hypomobility, increased fascial restrictions, improper body mechanics, postural dysfunction, and pain.     GOALS: Goals reviewed with patient? Yes  SHORT TERM GOALS: Target date: 08/31/2024   Independent in initial HEP  Baseline:  Goal status: INITIAL  2.  Resolution of radicular tingling L UE  Baseline:  Goal status: IN PROGRESS  3.  Increase cervical ROM by 2-5 degrees in limited planes of motion  Baseline:  Goal status: INITIAL   LONG TERM GOALS: Target date: 09/28/2024   Patient reports 80-90% improvement in cervical pain and discomfort  Baseline:  Goal status: INITIAL  2.  Improve posture and alignment with engagement of posterior shoulder girdle musculature and 5/5 strength in middle and lower traps Baseline:  Goal status: INITIAL  3.  Patient verbalizes and or demonstrates good posture & alignment for desk/computer work & reading to increase sitting time to > 2-3 hours Baseline:  Goal status: INITIAL  4.  Full, pain free cervical ROM  Baseline:  Goal status: INITIAL  5.  Improve PSFS: THE PATIENT SPECIFIC FUNCTIONAL SCALE score by 2 points   Place score of 0-10 (0 = unable to perform activity and 10 = able to perform activity at the same level as before injury or problem)  Activity Date: 08/03/24 08/19/24   Sitting > 2 hours  8    2.  Looking up  5 9   3. Looking up  5    4.      Total Score 18      Total Score = Sum of activity scores/number of activities 18/3= 6  Minimally Detectable Change: 3 points (for single activity); 2 points (for average score) Baseline:  Goal status: INITIAL  6.  Independent in advanced home program  Baseline:  Goal status: INITIAL   PLAN:  PT FREQUENCY: 2x/week  PT DURATION: 8 weeks  PLANNED INTERVENTIONS: 97164- PT Re-evaluation, 97110-Therapeutic exercises, 97530- Therapeutic activity, 97112- Neuromuscular re-education, 97535- Self Care, [id number]- Manual therapy, 97033- Ionotophoresis 4mg /ml Dexamethasone, Patient/Family education, Taping, and Joint mobilization  PLAN FOR NEXT SESSION: assess DN response,review and progress exercises; continue with spine care and ergonomic education and correction; manual work and modalities as indicated   Darice Conine, PT,DPT08/20/254:12 PM

## 2024-08-20 ENCOUNTER — Ambulatory Visit: Admitting: Rehabilitative and Restorative Service Providers"

## 2024-08-24 ENCOUNTER — Encounter: Admitting: Rehabilitative and Restorative Service Providers"

## 2024-08-27 ENCOUNTER — Encounter: Admitting: Rehabilitative and Restorative Service Providers"

## 2024-09-01 ENCOUNTER — Encounter: Payer: Self-pay | Admitting: Sports Medicine

## 2024-09-02 ENCOUNTER — Encounter: Payer: Self-pay | Admitting: Physical Therapy

## 2024-09-02 ENCOUNTER — Ambulatory Visit: Attending: Sports Medicine | Admitting: Physical Therapy

## 2024-09-02 DIAGNOSIS — M6281 Muscle weakness (generalized): Secondary | ICD-10-CM | POA: Diagnosis present

## 2024-09-02 DIAGNOSIS — M539 Dorsopathy, unspecified: Secondary | ICD-10-CM | POA: Insufficient documentation

## 2024-09-02 DIAGNOSIS — R29898 Other symptoms and signs involving the musculoskeletal system: Secondary | ICD-10-CM | POA: Diagnosis present

## 2024-09-02 NOTE — Therapy (Addendum)
 OUTPATIENT PHYSICAL THERAPY CERVICAL TREATMENT AND DISCHARGE   Patient Name: Kendra Proctor MRN: [id number] DOB:1951-10-05, 73 y.o., female Today's Date: 09/02/2024  END OF SESSION:  PT End of Session - 09/02/24 1530     Visit Number 5    Number of Visits 16    Date for PT Re-Evaluation 09/28/24    Authorization Type [employer] 100 visits/yr with OT copay $30    PT Start Time 1445    PT Stop Time 1525    PT Time Calculation (min) 40 min    Activity Tolerance Patient tolerated treatment well    Behavior During Therapy WFL for tasks assessed/performed              Past Medical History:  Diagnosis Date   Colon polyp    Coronary artery disease    High cholesterol    Hypertension    Past Surgical History:  Procedure Laterality Date   BREAST EXCISIONAL BIOPSY     KNEE SURGERY     TONSILLECTOMY     Patient Active Problem List   Diagnosis Date Noted   Radiculitis of left cervical region 07/27/2024   Insomnia 01/27/2024   Drug-induced constipation 12/10/2023   Nausea and vomiting 12/10/2023   Overweight 12/10/2023   Hypertension 08/30/2023   Hyperlipidemia 08/30/2023   Class 1 obesity due to excess calories without serious comorbidity with body mass index (BMI) of 31.0 to 31.9 in adult 08/30/2023   History of gout 08/30/2023   Tubular adenoma of colon 11/30/2022   PAF (paroxysmal atrial fibrillation) (HCC) 03/08/2022   Gout of foot 05/27/2020   Acquired hallux rigidus of left foot 05/22/2019   Osteopenia of left hip 11/29/2017   Seborrheic dermatitis of scalp 11/02/2016    PCP: Vermell LITTIE Bologna, PA-C  REFERRING PROVIDER: Dr Debby JINNY Petties  REFERRING DIAG: Radiculitis L cervical region   THERAPY DIAG:  Cervical dysfunction  Other symptoms and signs involving the musculoskeletal system  Muscle weakness (generalized)  Rationale for Evaluation and Treatment: Rehabilitation  ONSET DATE: 05/31/24  SUBJECTIVE:                                                                                                                                                                                                          SUBJECTIVE STATEMENT: Pt states she had a few days off and felt a lot better. She has been using the Hinge app for exercises  Eval: Patient reports that she started having pain in the neck in the past 2 months. She noticed sudden sharp pain in  the L neck and into the shoulder. She treated symptoms with OTC meds with some improvement. She tried prescription meds and that created side effects. Now only taking muscle relaxants.  Hand dominance: Right  PERTINENT HISTORY:  HTN; a-fib; has a watchman mesh in her heart to prevent blood clots; chronic gout; osteopenia  PAIN:  Are you having pain? Yes: NPRS scale: 2/10; best 0/10; worst 5-6/10  Pain location: L neck to shoulder  Pain description: dull; throbbing; may have sharp pain but it doesn't last long Aggravating factors: prolonged sitting > 2 hours; worse during the day Relieving factors: meds  PRECAUTIONS: osteopenia cervical spine   RED FLAGS: None     WEIGHT BEARING RESTRICTIONS: No  FALLS:  Has patient fallen in last 6 months? 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  DIAGNOSTIC FINDINGS:  Xray 07/27/24 cervical: Osteopenia. The cervical spine is visualized from C1-C6. Cervical alignment is maintained. Vertebral body heights are maintained: no evidence of acute fracture. Moderate intervertebral disc space height loss of C5-6. Mild intervertebral  disc space height loss at 4-5. Multilevel facet arthropathy and uncovertebral hypertrophy results in moderate RIGHT and mild-to-moderate LEFT osseous neuroforaminal narrowing at C3-4 and C5-6. Mild osseous neuroforaminal narrowing at bilateral C4-5. No prevertebral soft tissue swelling. Visualized thorax is unremarkable.   IMPRESSION: Multilevel degenerative changes of the cervical spine most pronounced at C5-6.  PATIENT SURVEYS:  PSFS: THE PATIENT SPECIFIC FUNCTIONAL SCALE  Place score of 0-10 (0 = unable to perform activity and 10 = able to perform activity at the same level as before injury or problem)  Activity Date: 08/03/24 Date 08/19/24   Sitting > 2 hours  8    2. Looking up  5    3. Looking up  5 9   4.      Total Score 18      Total Score = Sum of activity scores/number of activities 18/3= 6  Minimally Detectable Change: 3 points (for single activity); 2 points (for average score)  [name] (nd). The Patient Specific Functional Scale . Retrieved from [email or web address]   COGNITION: Overall cognitive status: Within functional limits for tasks assessed  SENSATION: Intermittent tingling in the arm above above elbow - lasting a few seconds to 1 min   POSTURE: rounded shoulders, forward head, increased thoracic kyphosis, and flexed trunk   PALPATION: Tight with PA and lateral mobs L through the thoracic spine Muscular tightness L > R pecs; upper trap; leveator    CERVICAL ROM:   Active ROM A/PROM (deg) eval 09/02/24  Flexion 52 pain 50  Extension 34 pain 32  Right lateral flexion 14 pull  26 pull  Left lateral flexion 25 32  Right rotation 58 51 pull  Left rotation 52 pull  61   (Blank rows = not tested)  UPPER EXTREMITY ROM: limited end ranges elevation; rotation  Active ROM Right eval Left eval  Shoulder flexion    Shoulder extension    Shoulder abduction    Shoulder adduction     Shoulder extension    Shoulder internal rotation  Shoulder external rotation    Elbow flexion    Elbow extension    Wrist flexion    Wrist extension    Wrist ulnar deviation    Wrist radial deviation    Wrist pronation    Wrist supination     (Blank rows = not tested)  UPPER EXTREMITY MMT:  MMT Right eval Left eval  Shoulder flexion    Shoulder extension    Shoulder abduction    Shoulder adduction    Shoulder extension    Shoulder internal rotation    Shoulder external rotation    Middle trapezius 4 4  Lower trapezius 4 4  Elbow flexion    Elbow extension    Wrist flexion    Wrist extension    Wrist ulnar deviation    Wrist radial deviation    Wrist pronation    Wrist supination    Grip strength     (Blank rows = not tested)  CERVICAL SPECIAL TESTS:  Spurling's test: Negative, and Distraction test: Negative  [facility] Adult PT Treatment:                                                DATE: 09/02/24 Therapeutic Exercise: ROM measurements (see above) UT stretch Thread the needle at wall Doorway stretch for pecs 60 degrees 3 x 30 sec Manual Therapy: PA jt mobs grade 2-3 T1-5 STM bilat UT and cervical paraspinals Trigger Point Dry Needling  Subsequent Treatment: Instructions provided previously at initial dry needling treatment.   Patient Verbal Consent Given: Yes Education Handout Provided: Previously Provided Muscles Treated: Lt UT Electrical Stimulation Performed: No Treatment Response/Outcome: twitch response, palpable increase in muscle length    [facility] Adult PT Treatment:                                                DATE: 08/19/24 Therapeutic Exercise: UT stretch Bilat ER x 10 Y lift off of wall x 10 - tactile cues for lower trap activation Thread the needle at wall x 10 bilat Manual Therapy: PA jt mobs grade 2-3 T1-5 STM bilat UT and cervical paraspinals Trigger Point Dry Needling  Subsequent Treatment: Instructions provided previously at  initial dry needling treatment.   Patient Verbal Consent Given: Yes Education Handout Provided: Previously Provided Muscles Treated: Lt UT, cervical paraspinals Electrical Stimulation Performed: No Treatment Response/Outcome: twitch response   [facility] Adult PT Treatment:                                                DATE: 08/12/24 Therapeutic Exercise: Doorway stretch 60 degrees 3 x 30 sec Scap retraction in doorway x 10 Bilat ER x 10 UT stretch Manual Therapy: PA mobs grade 2-3 T1-5 STM bilat UT and cervical paraspinals Trigger Point Dry Needling  Subsequent Treatment: Instructions provided previously at initial dry needling treatment.   Patient Verbal Consent Given: Yes Education Handout Provided: Previously Provided Muscles Treated: bilat UT, cervical paraspinals Electrical Stimulation Performed: No Treatment Response/Outcome: multiple twitch responses, palpable increase in muscle lengt     TREATMENT DATE:  08/06/24 3 way doorway stretch 3x 30 sec ea at 60 deg (assessed other angles but no stretch) Scapular retraction in doorway x 10 ER with retraction in doorway x 10 Cervical retraction 5 sec hold x 10 L UT stretch 2x 30 sec (attempted R side but pt got tingling into L UE)  Trigger Point Dry Needling  Initial Treatment: Pt instructed on Dry Needling rational, procedures, and possible side effects. Pt instructed to expect mild to moderate muscle soreness later in the day and/or into the next day.  Pt instructed in methods to reduce muscle soreness. Pt instructed to continue prescribed HEP. Because Dry Needling was performed over or adjacent to a lung field, pt was educated on S/S of pneumothorax and to seek immediate medical attention should they occur.  Patient was educated on signs and symptoms of infection and other risk factors and advised to seek  medical attention should they occur.  Patient verbalized understanding of these instructions and education.   Patient Verbal Consent Given: Yes Education Handout Provided: Yes Muscles Treated: B UT and cervical multifidi Electrical Stimulation Performed: No Treatment Response/Outcome: Utilized skilled palpation to identify bony landmarks and trigger points.  Able to illicit twitch response and muscle elongation.  Soft tissue mobilization to muscles needled to further promote tissue elongation and decreased pain.        PATIENT EDUCATION:  Education details: POC; HEP Person educated: Patient Education method: Programmer, Multimedia, Demonstration, Actor cues, Verbal cues, and Handouts Education comprehension: verbalized understanding, returned demonstration, verbal cues required, tactile cues required, and needs further education  HOME EXERCISE PROGRAM: Access Code: [email or web address] URL: [email or web address] Date: 08/19/2024 Prepared by: Darice Conine  Exercises - Seated Cervical Retraction  - 2 x daily - 7 x weekly - 1-2 sets - 5-10 reps - 10 sec  hold - Seated Passive Cervical Retraction  - 2 x daily - 7 x weekly - 1 sets - 5-10 reps - 5-10 sec  hold - Seated Scapular Retraction  - 2 x daily - 7 x weekly - 1-2 sets - 10 reps - 10 sec  hold - Shoulder External Rotation and Scapular Retraction  - 1-2 x daily - 7 x weekly - 1 sets - 10 reps - 3-5 sec   hold - Doorway Pec Stretch at 60 Degrees Abduction  - 3 x daily - 7 x weekly - 1 sets - 3 reps - Doorway Pec Stretch at 90 Degrees Abduction  - 3 x daily - 7 x weekly - 1 sets - 3 reps - 30 seconds  hold - Doorway Pec Stretch at 120 Degrees Abduction  - 3 x daily - 7 x weekly - 1 sets - 3 reps - 30 second hold  hold - Standing Pectoral Release with Ball at Wall  - 3-4 x daily - 7 x weekly - Standing Infraspinatus/Teres Minor Release with Ball at [employer]  - 2 x daily - 7 x weekly - Seated Cervical Sidebending Stretch  - 2 x daily - 7 x  weekly - 1 sets - 3 reps - 30-60 sec hold - Plank with Thoracic Rotation on Counter  - 1 x daily - 7 x weekly - 3 sets - 10 reps - Low Trap Setting at Wall  - 1 x daily - 7 x weekly - 3 sets - 10 reps    ASSESSMENT:  CLINICAL IMPRESSION: Pt with good response to dry needling. She feels confident with the combo of the Hinge exercise app exercises and PT provided HEP. She  plans to hold PT at this time   Eval: Patient is a 73 y.o. female who was seen today for physical therapy evaluation and treatment for L cervical radiculitis which was sudden onset with no known injury. Patient presents with poor posture and alignment; limited cervical and thoracic mobility/ROM; end range pain and tightness with cervical motions; tightness end range shoulder elevation and rotation; muscular tightness in the ant/lat/posterior cervical musculature, pec, upper traps. Patient has pain with sitting > 2 hours and intermittent radicular tingling L UE. Patient will benefit form PT to address problems identified.   OBJECTIVE IMPAIRMENTS: decreased activity tolerance, decreased mobility, decreased ROM, decreased strength, hypomobility, increased fascial restrictions, improper body mechanics, postural dysfunction, and pain.     GOALS: Goals reviewed with patient? Yes  SHORT TERM GOALS: Target date: 08/31/2024   Independent in initial HEP  Baseline:  Goal status: INITIAL  2.  Resolution of radicular tingling L UE  Baseline:  Goal status: IN PROGRESS  3.  Increase cervical ROM by 2-5 degrees in limited planes of motion  Baseline:  Goal status: INITIAL   LONG TERM GOALS: Target date: 09/28/2024   Patient reports 80-90% improvement in cervical pain and discomfort  Baseline:  Goal status: INITIAL  2.  Improve posture and alignment with engagement of posterior shoulder girdle musculature and 5/5 strength in middle and lower traps Baseline:  Goal status: INITIAL  3.  Patient verbalizes and or demonstrates  good posture & alignment for desk/computer work & reading to increase sitting time to > 2-3 hours Baseline:  Goal status: INITIAL  4.  Full, pain free cervical ROM  Baseline:  Goal status: INITIAL  5.  Improve PSFS: THE PATIENT SPECIFIC FUNCTIONAL SCALE score by 2 points   Place score of 0-10 (0 = unable to perform activity and 10 = able to perform activity at the same level as before injury or problem)  Activity Date: 08/03/24 08/19/24   Sitting > 2 hours  8    2. Looking up  5 9   3. Looking up  5    4.      Total Score 18      Total Score = Sum of activity scores/number of activities 18/3= 6  Minimally Detectable Change: 3 points (for single activity); 2 points (for average score) Baseline:  Goal status: INITIAL  6.  Independent in advanced home program  Baseline:  Goal status: INITIAL   PLAN:  PT FREQUENCY: 2x/week  PT DURATION: 8 weeks  PLANNED INTERVENTIONS: 97164- PT Re-evaluation, 97110-Therapeutic exercises, 97530- Therapeutic activity, 97112- Neuromuscular re-education, 97535- Self Care, 02859- Manual therapy, [phone]- Ionotophoresis 4mg /ml Dexamethasone, Patient/Family education, Taping, and Joint mobilization  PLAN FOR NEXT SESSION: hold PT at this time  Darice Conine, PT,DPT09/03/253:31 PM  PHYSICAL THERAPY DISCHARGE SUMMARY  Visits from Start of Care: 5  Current functional level related to goals / functional outcomes: Decreased pain   Remaining deficits: See above   Education / Equipment: HEP   Patient agrees to discharge. Patient goals were met. Patient is being discharged due to being pleased with the current functional level.  Darice Conine, PT,DPT11/25/2511:30 AM

## 2024-09-07 ENCOUNTER — Ambulatory Visit: Admitting: Sports Medicine

## 2024-10-09 ENCOUNTER — Other Ambulatory Visit: Payer: Self-pay | Admitting: Physician Assistant

## 2024-10-09 DIAGNOSIS — E782 Mixed hyperlipidemia: Secondary | ICD-10-CM

## 2024-10-27 ENCOUNTER — Ambulatory Visit: Admitting: Physician Assistant

## 2024-10-27 VITALS — BP 170/88 | HR 79 | Ht 67.0 in | Wt 173.0 lb

## 2024-10-27 DIAGNOSIS — M5030 Other cervical disc degeneration, unspecified cervical region: Secondary | ICD-10-CM

## 2024-10-27 DIAGNOSIS — I10 Essential (primary) hypertension: Secondary | ICD-10-CM

## 2024-10-27 DIAGNOSIS — I480 Paroxysmal atrial fibrillation: Secondary | ICD-10-CM | POA: Diagnosis not present

## 2024-10-27 DIAGNOSIS — E782 Mixed hyperlipidemia: Secondary | ICD-10-CM | POA: Diagnosis not present

## 2024-10-27 DIAGNOSIS — M542 Cervicalgia: Secondary | ICD-10-CM

## 2024-10-27 DIAGNOSIS — M1A0790 Idiopathic chronic gout, unspecified ankle and foot, without tophus (tophi): Secondary | ICD-10-CM

## 2024-10-27 DIAGNOSIS — G8929 Other chronic pain: Secondary | ICD-10-CM

## 2024-10-27 MED ORDER — TRAMADOL HCL 50 MG PO TABS: 50.0000 mg | ORAL_TABLET | Freq: Four times a day (QID) | ORAL | 0 refills | Status: AC | PRN

## 2024-10-27 MED ORDER — LOSARTAN POTASSIUM-HCTZ 100-12.5 MG PO TABS: 1.0000 | ORAL_TABLET | Freq: Every day | ORAL | 1 refills | Status: DC

## 2024-10-27 MED ORDER — CELECOXIB 200 MG PO CAPS: 200.0000 mg | ORAL_CAPSULE | Freq: Two times a day (BID) | ORAL | 0 refills | Status: DC

## 2024-10-27 MED ORDER — ORPHENADRINE CITRATE ER 100 MG PO TB12: 100.0000 mg | ORAL_TABLET | Freq: Two times a day (BID) | ORAL | 1 refills | Status: AC

## 2024-10-27 NOTE — Patient Instructions (Addendum)
 Slender[place] drops at [email or web address] [facility] for massage in [place] Tramadol  up to twice a day for pain Increase hyzaar to 100/12.5mg  and recheck nurse visit in 2 weeks Stop meloxicam  and start celebrex twice a day

## 2024-10-27 NOTE — Progress Notes (Signed)
 Established Patient Office Visit  Subjective   Patient ID: Kendra Proctor, female    DOB: 1951/11/10  Age: 73 y.o. MRN: [id number]  Chief Complaint  Patient presents with   Medical Management of Chronic Issues    HPI .Discussed the use of AI scribe software for clinical note transcription with the patient, who gave verbal consent to proceed.  History of Present Illness Kendra Proctor is a 73 year old female with hypertension and cervical degenerative disc disease who presents with neck and shoulder pain.  Neck and shoulder pain - Constant pain localized to the neck and left shoulder - Onset early this morning, disrupting sleep and daily functioning - Previous treatments include physical therapy and needling, which provided only temporary relief - Current medications include meloxicam  and tizanidine ; meloxicam  is ineffective and tizanidine  causes intolerable side effects - No other muscle relaxants have been tried - Pain management is challenging while working from home  Hypertension - Currently taking losartan  hydrochlorothiazide  50/12.5 mg - Blood pressure readings higher than usual, attributed to ongoing pain - Previously maintained blood pressure at 118/70 on lisinopril ; increase noted since switching medications - Monitors blood pressure at home and observes higher readings - No chest pain or shortness of breath - Usually high heart rate  Weight gain and appetite changes - Weight gain of eight pounds since last visit - Previous trials of weight loss medications including Contrave and Topamax, with only temporary success - [place] drops reduced appetite but were discontinued - Expresses dissatisfaction with current weight and seeks effective solutions  Hyperlipidemia - Currently taking atorvastatin  for cholesterol management - No side effects from atorvastatin     ROS See HPI.    Objective:     BP 138/84   Pulse 79   Ht 5' 7 (1.702 m)   Wt 173 lb  (78.5 kg)   SpO2 99%   BMI 27.10 kg/m  BP Readings from Last 3 Encounters:  10/27/24 138/84  06/08/24 124/86  04/27/24 121/83   Wt Readings from Last 3 Encounters:  10/27/24 173 lb (78.5 kg)  06/10/24 165 lb (74.8 kg)  04/27/24 166 lb (75.3 kg)      Physical Exam Constitutional:      Appearance: Normal appearance.  HENT:     Head: Normocephalic.  Cardiovascular:     Rate and Rhythm: Normal rate and regular rhythm.  Pulmonary:     Effort: Pulmonary effort is normal.     Breath sounds: Normal breath sounds.  Musculoskeletal:     Right lower leg: No edema.     Left lower leg: No edema.     Comments: Paraspinal neck tightness NROM of neck No pain to palpation over cervical spine  Neurological:     General: No focal deficit present.     Mental Status: She is alert and oriented to person, place, and time.  Psychiatric:        Mood and Affect: Mood normal.      The 10-year ASCVD risk score (Arnett DK, et al., 2019) is: 17.2%    Assessment & Plan:  SABRASABRALadonya was seen today for medical management of chronic issues.  Diagnoses and all orders for this visit:  Chronic neck pain -     orphenadrine (NORFLEX) 100 MG tablet; Take 1 tablet (100 mg total) by mouth 2 (two) times daily. -     celecoxib (CELEBREX) 200 MG capsule; Take 1 capsule (200 mg total) by mouth 2 (two) times daily. -  traMADol  (ULTRAM ) 50 MG tablet; Take 1 tablet (50 mg total) by mouth every 6 (six) hours as needed for up to 5 days.  Mixed hyperlipidemia  Primary hypertension -     CMP14+EGFR -     losartan -hydrochlorothiazide  (HYZAAR) 100-12.5 MG tablet; Take 1 tablet by mouth daily.  PAF (paroxysmal atrial fibrillation) (HCC)  Idiopathic chronic gout of foot without tophus, unspecified laterality  DDD (degenerative disc disease), cervical -     orphenadrine (NORFLEX) 100 MG tablet; Take 1 tablet (100 mg total) by mouth 2 (two) times daily. -     celecoxib (CELEBREX) 200 MG capsule; Take 1  capsule (200 mg total) by mouth 2 (two) times daily. -     traMADol  (ULTRAM ) 50 MG tablet; Take 1 tablet (50 mg total) by mouth every 6 (six) hours as needed for up to 5 days.   Assessment & Plan Cervical Degenerative Disc Disease/chronic neck pain Chronic neck and shoulder pain affecting sleep and daily activities. Previous treatments provided temporary relief. Current medications not well tolerated. Discussed alternative muscle relaxers and deep tissue massage therapy. - Switch meloxicam  to Celebrex twice a day. - Stop tizanidine  and trial Norflex twice a day. - Consider referral to orthopedics and injection if no benefit from current treatment. - Recommend deep tissue massage therapy. - Prescribe tramadol  up to twice a day for pain.  Hypertension Elevated blood pressure likely due to neck and shoulder pain. Current medication is losartan  hydrochlorothiazide  50/12.5 mg. Previous lisinopril  maintained better control. Goal is to maintain blood pressure under 130/80. - Increase losartan  to 100/12.5 mg. - Recheck blood pressure in two weeks with nurse visit. - Continue home blood pressure log.  Hyperlipidemia Currently on atorvastatin  with no side effects. Previous simvastatin was less effective. - Continue atorvastatin . - Check CMP.  Obesity Weight gain of 8 pounds since last visit. Previous weight loss attempts were not sustained. She did not tolerate GLP-1's, phentermine is not an option with A.fib and failed wellbutrin before. Discussed Slender[place] drops for appetite control. - Consider trying drops before meals. - Provide information on drops and where to purchase.  Follow up in office in 3 months.       Chaniya Genter, PA-C

## 2024-10-28 ENCOUNTER — Encounter: Payer: Self-pay | Admitting: Physician Assistant

## 2024-10-28 ENCOUNTER — Ambulatory Visit: Payer: Self-pay | Admitting: Physician Assistant

## 2024-10-28 DIAGNOSIS — G8929 Other chronic pain: Secondary | ICD-10-CM | POA: Insufficient documentation

## 2024-10-28 DIAGNOSIS — M5030 Other cervical disc degeneration, unspecified cervical region: Secondary | ICD-10-CM | POA: Insufficient documentation

## 2024-10-28 LAB — CMP14+EGFR
ALT: 7 IU/L (ref 0–32)
AST: 20 IU/L (ref 0–40)
Albumin: 4.2 g/dL (ref 3.8–4.8)
Alkaline Phosphatase: 78 IU/L (ref 49–135)
BUN/Creatinine Ratio: 24 (ref 12–28)
BUN: 18 mg/dL (ref 8–27)
Bilirubin Total: 0.7 mg/dL (ref 0.0–1.2)
CO2: 23 mmol/L (ref 20–29)
Calcium: 9.6 mg/dL (ref 8.7–10.3)
Chloride: 100 mmol/L (ref 96–106)
Creatinine, Ser: 0.74 mg/dL (ref 0.57–1.00)
Globulin, Total: 2.9 g/dL (ref 1.5–4.5)
Glucose: 91 mg/dL (ref 70–99)
Potassium: 4 mmol/L (ref 3.5–5.2)
Sodium: 137 mmol/L (ref 134–144)
Total Protein: 7.1 g/dL (ref 6.0–8.5)
eGFR: 86 mL/min/1.73 (ref >59)

## 2024-10-28 NOTE — Progress Notes (Signed)
 Normal labs.

## 2024-11-10 ENCOUNTER — Ambulatory Visit

## 2024-11-11 NOTE — Progress Notes (Unsigned)
   Subjective:    Patient ID: Kendra Proctor, female    DOB: 1951/04/30, 73 y.o.   MRN: [id number]  HPI  Patient is for a BP 2 week recheck. Last OV they read 170/88 and 142/93. Patient was originally taking losartan -hydrochlorothiazide  50-12.5, this was increased to 100-12.5 in her last OV 10/27/24. Denies CP, SOB, palpitations, or vision change.  Review of Systems     Objective:           Assessment & Plan:   Patients first BP reading was pt sat for 10 mins and it was retaken. The second BP reading was reported to [facility], PA-C who would

## 2024-11-12 ENCOUNTER — Ambulatory Visit

## 2024-11-12 ENCOUNTER — Ambulatory Visit (INDEPENDENT_AMBULATORY_CARE_PROVIDER_SITE_OTHER)

## 2024-11-12 ENCOUNTER — Other Ambulatory Visit: Payer: Self-pay

## 2024-11-12 VITALS — BP 161/69 | HR 93 | Ht 67.0 in

## 2024-11-12 DIAGNOSIS — I10 Essential (primary) hypertension: Secondary | ICD-10-CM

## 2024-11-12 MED ORDER — LOSARTAN POTASSIUM-HCTZ 100-25 MG PO TABS: 1.0000 | ORAL_TABLET | Freq: Every day | ORAL | 1 refills | Status: DC

## 2024-11-12 NOTE — Progress Notes (Signed)
 Meds ordered this encounter  Medications   losartan -hydrochlorothiazide  (HYZAAR) 100-25 MG tablet    Sig: Take 1 tablet by mouth daily.    Dispense:  30 tablet    Refill:  1

## 2024-11-12 NOTE — Addendum Note (Signed)
 Addended by: Harjot Dibello D on: 11/12/2024 05:18 PM   Modules accepted: Orders

## 2024-11-12 NOTE — Telephone Encounter (Signed)
 Pended for medication change from nursing visit today .

## 2024-11-12 NOTE — Progress Notes (Signed)
   Established Patient Office Visit  Subjective   Patient ID: Kendra Proctor, female    DOB: 23-May-1951  Age: 73 y.o. MRN: [id number]  Chief Complaint  Patient presents with   Hypertension    BP check nurse visit.     HPI  Hypertension- BP check nurse visit. Patient denies chest  pain, shortness of breath,  palpitations,  headaches.  She states she has had pain in left side top shoulder/ neck and wonders  if this could be the cause . She also acknowledges vision changes within this last year but will be seeing ophthalmology for follow up  for this.  Patient states Lisinopril  was stopped at last visit due to dry cough that has improved without using the medication. She has been taking Losartan / hydrochlorothiazide  100/12.5mg  and metoprolol 100mg  BID.  ROS    Objective:     BP (!) 161/69   Pulse 93   Ht 5' 7 (1.702 m)   SpO2 97%   BMI 27.10 kg/m    Physical Exam   No results found for any visits on 11/12/24.    The 10-year ASCVD risk score (Arnett DK, et al., 2019) is: 22.7%    Assessment & Plan:  BP check nurse visit. Initial reading = 144/78. Second reading = 161/69. As Kendra Bologna, PA is out of the office today - this was reviewed by Dr. Alvan who  changed Losartan  /hydrochlorothiazide  from 100/12.5mg  to Losartan  /hydrochlorothiazide  100/50mg - ( pended new script for Dr. Alvan to sign off on in separate message).  Patient will schedule another 2 week follow up visit as nurse visit after making the medication change. She will bring her home cuff for comparison on this date as well.  Problem List Items Addressed This Visit       Cardiovascular and Mediastinum   Hypertension - Primary    Return in about 2 weeks (around 11/26/2024) for BP check nurse visit .    Kendra SHAUNNA Plenty, LPN

## 2024-11-12 NOTE — Patient Instructions (Signed)
 schedule another 2 week follow up visit as nurse visit after making the medication change.

## 2024-11-18 ENCOUNTER — Other Ambulatory Visit: Payer: Self-pay | Admitting: Physician Assistant

## 2024-11-23 ENCOUNTER — Encounter (INDEPENDENT_AMBULATORY_CARE_PROVIDER_SITE_OTHER): Payer: Self-pay

## 2024-11-23 ENCOUNTER — Telehealth: Payer: Self-pay | Admitting: Physician Assistant

## 2024-11-23 NOTE — Telephone Encounter (Signed)
 error

## 2024-11-24 ENCOUNTER — Ambulatory Visit: Attending: Family Medicine

## 2024-11-24 ENCOUNTER — Ambulatory Visit: Admitting: Family Medicine

## 2024-11-24 ENCOUNTER — Encounter: Payer: Self-pay | Admitting: Family Medicine

## 2024-11-24 VITALS — BP 140/116 | HR 70 | Ht 67.0 in | Wt 173.0 lb

## 2024-11-24 DIAGNOSIS — R55 Syncope and collapse: Secondary | ICD-10-CM

## 2024-11-24 DIAGNOSIS — I10 Essential (primary) hypertension: Secondary | ICD-10-CM

## 2024-11-24 DIAGNOSIS — I480 Paroxysmal atrial fibrillation: Secondary | ICD-10-CM

## 2024-11-24 NOTE — Assessment & Plan Note (Signed)
 Paroxysmal atrial fibrillation Managed with metoprolol and aspirin. No recent palpitations or tachycardia. Metoprolol dose adjustment considered. - Continue metoprolol for rate control. - Ordered echocardiogram for cardiac function.

## 2024-11-24 NOTE — Assessment & Plan Note (Addendum)
 Hypertension Recent home readings in 160s. Losartan  dosage changed. Further adjustment may be needed based on heart rate and blood pressure. - Monitor blood pressure at home. - Ordered blood work for electrolytes and kidney function.  Would like to work up syncope for making more med adjustments.

## 2024-11-24 NOTE — Progress Notes (Signed)
 Acute Office Visit  Patient ID: Kendra Proctor, female    DOB: January 26, 1951, 73 y.o.   MRN: [id number]  PCP: Antoniette Vermell CROME, PA-C  Chief Complaint  Patient presents with   Fall    Subjective:     HPI  Discussed the use of AI scribe software for clinical note transcription with the patient, who gave verbal consent to proceed.  History of Present Illness Kendra Proctor is a 73 year old female with atrial fibrillation who presents with a recent episode of syncope.  Syncope - Sudden episode of syncope occurred on Saturday while sitting - Preceded by abnormal sensation in her hand described as 'kind of funky' - No clear recollection of losing consciousness; event was very quick - Found herself on the floor after the episode - No previous episodes of syncope - No associated symptoms of heart palpitations, lightheadedness, or recent illness - No unusual swelling or bleeding, except for bruising from the fall  Atrial fibrillation and cardiovascular management - History of atrial fibrillation - Currently taking metoprolol 200 mg daily, divided into morning and evening doses - Missed evening dose of metoprolol the night before the visit - Has a Watchman device implanted - Taking aspirin for stroke prevention due to refusal of other blood thinners  Antihypertensive therapy and blood pressure variability - Losartan  dosage recently adjusted from 25 mg to 12.5 mg, then back to 25 mg - Recent home blood pressure readings in the 160s, which is higher than earlier in the week  Alcohol intake - Consumed lunch and a couple of alcoholic drinks with her niece on the day of the incident   ROS     Objective:    BP (!) 140/116   Pulse 70   Ht 5' 7 (1.702 m)   Wt 173 lb (78.5 kg)   SpO2 99%   BMI 27.10 kg/m    Physical Exam Vitals and nursing note reviewed.  Constitutional:      Appearance: Normal appearance.  HENT:     Head: Normocephalic and atraumatic.     Comments:  Bruising on her right chin and upper lip  Eyes:     Conjunctiva/sclera: Conjunctivae normal.  Cardiovascular:     Rate and Rhythm: Normal rate. Rhythm irregular.  Pulmonary:     Effort: Pulmonary effort is normal.     Breath sounds: Normal breath sounds.  Skin:    General: Skin is warm and dry.  Neurological:     Mental Status: She is alert.  Psychiatric:        Mood and Affect: Mood normal.       No results found for any visits on 11/24/24.     Assessment & Plan:   Problem List Items Addressed This Visit       Cardiovascular and Mediastinum   PAF (paroxysmal atrial fibrillation) (HCC) - Primary   Paroxysmal atrial fibrillation Managed with metoprolol and aspirin. No recent palpitations or tachycardia. Metoprolol dose adjustment considered. - Continue metoprolol for rate control. - Ordered echocardiogram for cardiac function.      Relevant Orders   CBC with Differential/Platelet   CMP14+EGFR   TSH   Hypertension   Hypertension Recent home readings in 160s. Losartan  dosage changed. Further adjustment may be needed based on heart rate and blood pressure. - Monitor blood pressure at home. - Ordered blood work for electrolytes and kidney function.  Would like to work up syncope for making more med adjustments.  Other Visit Diagnoses       Syncope, unspecified syncope type       Relevant Orders   CBC with Differential/Platelet   CMP14+EGFR   TSH   ECHOCARDIOGRAM COMPLETE   LONG TERM MONITOR XT (3-14 DAYS)       Assessment and Plan Assessment & Plan Syncope Recent syncope episode with unclear cause. Considered arrhythmia, medication side effects, or anemia. No recent echocardiogram post-Watchman device. - Ordered EKG to assess cardiac rhythm. - Ordered blood work for sodium, potassium, and anemia. - Ordered 14-day heart monitor for arrhythmias. - Ordered echocardiogram for cardiac function.   EKG shows rate of 78 bpm, afib, no acute changes.  No  old EKG for comparison.      No orders of the defined types were placed in this encounter.   Return if symptoms worsen or fail to improve.  Dorothyann Byars, MD [facility] at [facility]

## 2024-11-24 NOTE — Progress Notes (Unsigned)
 EP to read.

## 2024-11-25 ENCOUNTER — Ambulatory Visit: Payer: Self-pay | Admitting: Family Medicine

## 2024-11-25 LAB — CMP14+EGFR
ALT: 12 IU/L (ref 0–32)
AST: 30 IU/L (ref 0–40)
Albumin: 4.4 g/dL (ref 3.8–4.8)
Alkaline Phosphatase: 87 IU/L (ref 49–135)
BUN/Creatinine Ratio: 26 (ref 12–28)
BUN: 22 mg/dL (ref 8–27)
Bilirubin Total: 1.5 mg/dL — ABNORMAL HIGH (ref 0.0–1.2)
CO2: 23 mmol/L (ref 20–29)
Calcium: 9.9 mg/dL (ref 8.7–10.3)
Chloride: 96 mmol/L (ref 96–106)
Creatinine, Ser: 0.84 mg/dL (ref 0.57–1.00)
Globulin, Total: 3.2 g/dL (ref 1.5–4.5)
Glucose: 90 mg/dL (ref 70–99)
Potassium: 4 mmol/L (ref 3.5–5.2)
Sodium: 137 mmol/L (ref 134–144)
Total Protein: 7.6 g/dL (ref 6.0–8.5)
eGFR: 74 mL/min/1.73 (ref >59)

## 2024-11-25 LAB — CBC WITH DIFFERENTIAL/PLATELET
Basophils Absolute: 0 x10E3/uL (ref 0.0–0.2)
Basos: 1 %
EOS (ABSOLUTE): 0.1 x10E3/uL (ref 0.0–0.4)
Eos: 2 %
Hematocrit: 43 % (ref 34.0–46.6)
Hemoglobin: 14.8 g/dL (ref 11.1–15.9)
Immature Grans (Abs): 0 x10E3/uL (ref 0.0–0.1)
Immature Granulocytes: 0 %
Lymphocytes Absolute: 1.9 x10E3/uL (ref 0.7–3.1)
Lymphs: 23 %
MCH: 31.6 pg (ref 26.6–33.0)
MCHC: 34.4 g/dL (ref 31.5–35.7)
MCV: 92 fL (ref 79–97)
Monocytes Absolute: 0.7 x10E3/uL (ref 0.1–0.9)
Monocytes: 8 %
Neutrophils Absolute: 5.5 x10E3/uL (ref 1.4–7.0)
Neutrophils: 66 %
Platelets: 250 x10E3/uL (ref 150–450)
RBC: 4.68 x10E6/uL (ref 3.77–5.28)
RDW: 14 % (ref 11.7–15.4)
WBC: 8.3 x10E3/uL (ref 3.4–10.8)

## 2024-11-25 LAB — TSH: TSH: 2.55 u[IU]/mL (ref 0.450–4.500)

## 2024-11-25 NOTE — Progress Notes (Signed)
 Hi Zeba, overall metabolic panel looks good your electrolytes are normal nothing out of whack.  Blood count looks great to no sign of anemia.  Thyroid level looks perfect at 2.5.  I have ordered the heart monitor and the echocardiogram so they should be contacting you after Thanksgiving to get you scheduled.

## 2024-11-30 ENCOUNTER — Ambulatory Visit (INDEPENDENT_AMBULATORY_CARE_PROVIDER_SITE_OTHER)

## 2024-11-30 VITALS — BP 87/65 | HR 75

## 2024-11-30 DIAGNOSIS — I10 Essential (primary) hypertension: Secondary | ICD-10-CM

## 2024-11-30 NOTE — Progress Notes (Signed)
   Established Patient Office Visit  Subjective   Patient ID: Kendra Proctor, female    DOB: [birth year]  Age: 73 y.o. MRN: [id number]  Chief Complaint  Patient presents with   Hypertension    BP check nurse visit     HPI  Hypertension- BP check nurse visit. Patient denies chest pain shortness of breath, palpitaitons, headaches , vision changes or medication problems. Patient states she currently is taking Losartan  / hydrochlorothiazide  100/25mg  tablet  once daily and metroprolol succinate BID .   ROS    Objective:     BP (!) 87/65 (BP Location: Right Arm, Patient Position: Sitting)   Pulse 75   SpO2 98%    Physical Exam   No results found for any visits on 11/30/24.    The ASCVD Risk score (Arnett DK, et al., 2019) failed to calculate for the following reasons:   The valid systolic blood pressure range is 90 to 200 mmHg    Assessment & Plan:  BP check nurse visit-  initial reading = 104/59. Second reading -left arm 73/58 and right arm 87/65.  Per Vermell Bologna, PA reduce the Losartan / hydrochlorothiazide  to 50 mg /12.5mg   and recheck at nurse visit in 2 weeks. Need to be sure hydrating well.  Problem List Items Addressed This Visit       Cardiovascular and Mediastinum   Hypertension - Primary    Return in about 2 weeks (around 12/14/2024) for BP check as nurse visit. SABRA Suzen SHAUNNA Alpheus, LPN

## 2024-11-30 NOTE — Patient Instructions (Signed)
 Reduce Losartan  / hydrochlorothiazide   to 50mg / 12.5mg   once daily. Return in 2 weeks for nurse visit for BP check

## 2024-12-04 NOTE — Addendum Note (Signed)
 Addended by: BONNY JON DEL on: 12/04/2024 11:44 AM   Modules accepted: Orders

## 2024-12-11 NOTE — Progress Notes (Signed)
° °  Subjective:    Patient ID: Kendra Proctor, female    DOB: [birth year], 73 y.o.   MRN: [id number]  HPI  Patient is here for a 2 week BP check. Per Vermell Bologna, PA reduced the losartan  hydrochlorothiazide  to 50-12.5 from losartan  hydrochlorothiazide  100-25 and recheck at nurse visit in 2 weeks. Need to be sure hydrating well. Denies CP, SOB, dizziness, irregular heart rate, or vision changes.  Review of Systems     Objective:   Physical Exam        Assessment & Plan:   Patients first BP reading is 127/82 reported PCP jade who is satisfied with this reading. Advised patient to cont. The losartan  hydrochlorothiazide  to 50-12.5 6 month sent in today Per Jade and f/u PRN.

## 2024-12-14 ENCOUNTER — Ambulatory Visit

## 2024-12-14 VITALS — BP 120/60 | HR 77 | Resp 18 | Ht 67.0 in | Wt 173.0 lb

## 2024-12-14 DIAGNOSIS — I10 Essential (primary) hypertension: Secondary | ICD-10-CM

## 2024-12-14 MED ORDER — LOSARTAN POTASSIUM-HCTZ 50-12.5 MG PO TABS: 1.0000 | ORAL_TABLET | Freq: Every day | ORAL | 1 refills | Status: AC

## 2024-12-22 ENCOUNTER — Ambulatory Visit (HOSPITAL_BASED_OUTPATIENT_CLINIC_OR_DEPARTMENT_OTHER)

## 2025-01-02 DIAGNOSIS — R55 Syncope and collapse: Secondary | ICD-10-CM | POA: Diagnosis not present

## 2025-01-08 NOTE — Progress Notes (Signed)
 Kendra Proctor,   You heart monitor did show symptoms were in response to atrial fibrillation. Yes cardiology can see these results. I see referral to atrial fibrillation clinic was also placed. Let me know if you have any questions!

## 2025-01-17 ENCOUNTER — Other Ambulatory Visit: Payer: Self-pay | Admitting: Physician Assistant

## 2025-01-17 DIAGNOSIS — E782 Mixed hyperlipidemia: Secondary | ICD-10-CM
